# Patient Record
Sex: Female | Born: 1970 | Race: White | Hispanic: Yes
Health system: Western US, Academic
[De-identification: ages and names within clinical notes are randomized; demographics above are authoritative.]

## PROBLEM LIST (undated history)

## (undated) DIAGNOSIS — IMO0001 Reserved for inherently not codable concepts without codable children: Secondary | ICD-10-CM

## (undated) DIAGNOSIS — F419 Anxiety disorder, unspecified: Secondary | ICD-10-CM

## (undated) HISTORY — DX: Reserved for inherently not codable concepts without codable children: IMO0001

## (undated) HISTORY — DX: Anxiety disorder, unspecified: F41.9

---

## 2002-08-17 ENCOUNTER — Encounter (INDEPENDENT_AMBULATORY_CARE_PROVIDER_SITE_OTHER): Payer: Self-pay | Admitting: Internal Medicine

## 2002-08-17 ENCOUNTER — Ambulatory Visit (INDEPENDENT_AMBULATORY_CARE_PROVIDER_SITE_OTHER): Payer: PRIVATE HEALTH INSURANCE | Admitting: Internal Medicine

## 2002-08-17 VITALS — BP 104/68 | HR 90 | Temp 99.8°F | Wt 99.0 lb

## 2002-08-17 MED ORDER — ALLEGRA 180 MG OR TABS
ORAL_TABLET | ORAL | Status: DC
Start: 2002-08-17 — End: 2016-05-21

## 2002-08-17 NOTE — Nursing Note (Signed)
>>   Daryel Gerald [facility]                    08/17/2002 4:44 pm  Pt. here for both side ear pain X on and off. Seen ENT 3months ago at [facility].

## 2002-08-17 NOTE — Progress Notes (Signed)
Vickie Porter is a 31 year old female. Patient presents with:    Otitis      History of Present Illness:  Vickie Porter presents with bilateral ear pain and pressure for the past several months. The pain typically comes in both ears at once, is 8/10 at worst but is usually around 5/10. It is associated with a feeling of pressure in the ears, an inability to "pop" her ears, and decreased hearing. The pain radiates down toward the angle of the jaw and is sometimes worse with chewing. She has been seen for this problem in the past and has even been seen by an ENT who told her that the problem is from bruxism and that she needs to wear a mouthguard. SHe has been doing this but feels her symptoms have not improved in the past few months while wearing it.  She does not give a classic history for allergic rhinitis -- denies significant nasal congestion, rhinorrhea, sneezing, itchy or watery eyes.    Review of patient's past medical history indicates:   NO PAST MEDICAL HISTORY     Review of patient's past surgical history indicates:   removal of benign ovarian tumor 2002    Comment: [facility]    There is no problem list on file for this patient.    Current outpatient prescriptions:  none    Review of patient's family history indicates:   Cancer Mother    Comment: birth mother died of GI malignancy at 51   Cancer Father    Comment: prostate, died at age 7       Tobacco Use: Never    Alcohol Use: No       Allergies:Review of patient's allergies indicates no known allergies.      ROS:  Gen:no fever, sweats, chills, weight loss  Eye:no blurry vision, double vision, loss of vision  Ear:see HPI  Nose/Throat:no congestion or epistaxis, no sore throat  CV:no chest pain, palpitations, dyspnea, syncope, orthopnea, or PND  Pulm:no cough, sputum, hemoptysis, dyspnea, chest pain        Exam:  Vitals: T 99.8 P 90 BP 104/68  Gen: healthy, alert, no distress  HEENT:normocephalic, external auditory canals and tympanic  membranes clear, PERRLA, EOMI, normal nasal mucosa, minimal bilateral TMJ tenderness, no apparent jaw dislocation with movement, MMM, good dentition, no pharyngeal erythema  CV: PMI nondisplaced, regular rate and rhythm, no murmur, rub, or gallop  LUNG:clear to auscultation and percussion  EXT:no clubbing, cyanosis, or edema      IMPRESSION/PLAN:    1. otalgia - My differential includes allergies and TMJ syndrome. She describes some features of each of these. Trial of allegra. Continue to wear mouthguard. Return one week. If no improvement on allegra, stop it and try tylenol or NSAID.    388.70 OTALGIA NOS (primary encounter diagnosis)  Note:   Plan: ALLEGRA 180 MG OR TABS

## 2010-09-02 ENCOUNTER — Other Ambulatory Visit (HOSPITAL_COMMUNITY): Payer: Self-pay

## 2010-09-30 ENCOUNTER — Ambulatory Visit: Payer: Enrolled Prime—HMO | Attending: Adult Congenital Heart Disease | Admitting: Adult Congenital Heart Disease

## 2010-09-30 DIAGNOSIS — R0602 Shortness of breath: Secondary | ICD-10-CM | POA: Insufficient documentation

## 2010-09-30 DIAGNOSIS — R109 Unspecified abdominal pain: Secondary | ICD-10-CM | POA: Insufficient documentation

## 2010-09-30 DIAGNOSIS — Q268 Other congenital malformations of great veins: Secondary | ICD-10-CM | POA: Insufficient documentation

## 2010-09-30 DIAGNOSIS — R002 Palpitations: Secondary | ICD-10-CM | POA: Insufficient documentation

## 2010-09-30 DIAGNOSIS — R0789 Other chest pain: Secondary | ICD-10-CM | POA: Insufficient documentation

## 2010-11-04 ENCOUNTER — Ambulatory Visit: Payer: Enrolled Prime—HMO | Attending: Adult Congenital Heart Disease

## 2010-11-04 DIAGNOSIS — R0989 Other specified symptoms and signs involving the circulatory and respiratory systems: Secondary | ICD-10-CM | POA: Insufficient documentation

## 2010-11-18 ENCOUNTER — Ambulatory Visit: Payer: Enrolled Prime—HMO | Attending: Adult Congenital Heart Disease | Admitting: Adult Congenital Heart Disease

## 2010-11-18 DIAGNOSIS — Q268 Other congenital malformations of great veins: Secondary | ICD-10-CM | POA: Insufficient documentation

## 2011-11-18 ENCOUNTER — Ambulatory Visit: Payer: Enrolled Prime—HMO | Attending: Adult Congenital Heart Disease

## 2011-11-18 DIAGNOSIS — R0602 Shortness of breath: Secondary | ICD-10-CM | POA: Insufficient documentation

## 2011-11-18 DIAGNOSIS — R5383 Other fatigue: Secondary | ICD-10-CM | POA: Insufficient documentation

## 2011-11-18 DIAGNOSIS — Q263 Partial anomalous pulmonary venous connection: Secondary | ICD-10-CM | POA: Insufficient documentation

## 2011-11-18 DIAGNOSIS — R5381 Other malaise: Secondary | ICD-10-CM | POA: Insufficient documentation

## 2011-11-19 ENCOUNTER — Encounter (HOSPITAL_BASED_OUTPATIENT_CLINIC_OR_DEPARTMENT_OTHER): Payer: Enrolled Prime—HMO | Admitting: Adult Congenital Heart Disease

## 2012-12-12 ENCOUNTER — Ambulatory Visit (HOSPITAL_BASED_OUTPATIENT_CLINIC_OR_DEPARTMENT_OTHER)

## 2012-12-12 ENCOUNTER — Other Ambulatory Visit (HOSPITAL_BASED_OUTPATIENT_CLINIC_OR_DEPARTMENT_OTHER): Payer: Self-pay | Admitting: Internal Medicine

## 2012-12-12 ENCOUNTER — Ambulatory Visit: Attending: Internal Medicine

## 2012-12-12 DIAGNOSIS — R0989 Other specified symptoms and signs involving the circulatory and respiratory systems: Secondary | ICD-10-CM | POA: Insufficient documentation

## 2012-12-12 DIAGNOSIS — Q268 Other congenital malformations of great veins: Secondary | ICD-10-CM | POA: Insufficient documentation

## 2012-12-14 LAB — CARDIAC MRI FLOW/VELOCITY

## 2012-12-21 ENCOUNTER — Ambulatory Visit: Attending: Cardiovascular Disease

## 2012-12-21 DIAGNOSIS — I501 Left ventricular failure: Secondary | ICD-10-CM | POA: Insufficient documentation

## 2012-12-21 DIAGNOSIS — I428 Other cardiomyopathies: Secondary | ICD-10-CM | POA: Insufficient documentation

## 2013-12-27 ENCOUNTER — Encounter (HOSPITAL_BASED_OUTPATIENT_CLINIC_OR_DEPARTMENT_OTHER)

## 2014-01-17 ENCOUNTER — Encounter (HOSPITAL_BASED_OUTPATIENT_CLINIC_OR_DEPARTMENT_OTHER): Payer: Self-pay

## 2014-01-17 ENCOUNTER — Ambulatory Visit: Attending: Adult Congenital Heart Disease | Admitting: Adult Congenital Heart Disease

## 2014-01-17 VITALS — BP 103/70 | HR 89 | Ht <= 58 in | Wt 95.0 lb

## 2014-01-17 DIAGNOSIS — Q263 Partial anomalous pulmonary venous connection: Secondary | ICD-10-CM | POA: Insufficient documentation

## 2014-01-17 NOTE — Patient Instructions (Signed)
1. Follow up next year with a exercise test (bicycle CPET) and cardiac MRI  2. After that will follow-up every-other-year  3. Keep exercising.

## 2014-01-17 NOTE — Progress Notes (Signed)
[facility]  Visit date: 01/17/2014    Primary Care Physician: Jacob Moores T  Attending Cardiologist: Rob Bunting     Chief Complaint  Follow-up for partial anomalous pulmonary venous connection    Patient Active Problem List    Diagnosis Date Noted   . 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  Right inferior pulmonary vein to the inferior vena cava  A) cardiac MRI January 2014: QP QS 1.2-1.  Right ventricular end-diastolic volume approximately 85 mL per meter squared.  Note that this is incorrectly reported in the official radiology read.  B) Normal to above average exercise performance on serial cardiopulmonary exercise tests  C) Occasional palpitations which sound like isolated premature beats             History  43 year old female with partial anomalous pulmonary venous connection.  She was incidentally diagnosed in 2011 when a CT scan of the abdomen showed anomalous pulmonary vein draining to the inferior vena cava.  This was followed up with a cardiac MRI by Dr. 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  She was referred here to the Adult Congenital Heart Service at the [facility] for further evaluation.  At the time of her initial presentation she had a myriad of symptoms most of which were not cardiac in nature.  She had recently completed therapy for tuberculosis and was having considerable weight loss and being evaluated for Crohn's.  She had significant fatigue.  She underwent a cardiopulmonary exercise test which was reassuring with above average exercise tolerance.  We have followed her serially here at the [facility] and found right ventricular volumes to be stable and exercise performance remains normal or supra normal.    Since last visit she has felt well.  She exercises almost daily for 70 minutes on the treadmill at an incline.   Her energy level is improved.  She describes occasional skipped or early beats but has not had any palpitations which are prolonged.  She checks her pulse when these happen and finds a heart rate usually about 80-85 bpm.    Current Outpatient Prescriptions   Medication Sig Dispense Refill   . ALLEGRA 180 MG OR TABS Take 1 tablet by mouth daily for allergies  30  0   . Omeprazole 20 MG Oral CAPSULE DELAYED RELEASE Take 20 mg by mouth daily on an empty stomach.       . Unclassified (OTHER MEDS, SEE COMMENTS,) Muscle relaxer PRN for back pain         No current facility-administered medications for this visit.        Review of patient's allergies indicates:  Allergies   Allergen Reactions   . Benadryl [Diphenhydramine Hcl] Rash     TX FROM [facility]     . Iodine Shortness of Breath     TX FROM [facility]     . Percocet [Oxycodone-Acetaminophen] Rash     TX FROM [facility]         Her family history includes Cancer in her father and mother.    She  reports that she has never smoked. She does not have any smokeless tobacco history on file. She reports that she does not drink alcohol or use illicit drugs.    Review of Systems:   Complete review of systems is negative except as detailed in the history of  present illness.    Physical Exam:  BP 103/70  Pulse 89  Ht 4' 6.5" (1.384 m)  Wt 95 lb (43.092 kg)  BMI 22.5 kg/m2  SpO2 100%    Eyes/lids: PERRLA/EOMI and nl sclerae  ENT: nl carotid pulse waveform, no carotid bruits and no oropharyngeal lesions, good dentition  Head/Neck: normocephelic, atraumatic, supple, non-tender and no JVD at 30 degrees of incline  Respiratory: nl resp effort and no rales/wheeze/rhonchi  Cardiovascular: Regular rate and rhythm.  There is a normal S1.  S2 is physiologically split.  There is a 1/6 soft systolic ejection murmur most likely a flow murmur.  GI: nl bowel sounds, non-tender, non-distended and no masses, organomegaly  Musculoskeletal: nl gait, stance, no club, cyan, edema and nl bulk and  tone  Extremities: Normal, without deformities, edema, or skin discoloration, radial and DP pulses 2+ bilaterally  Skin: warm, well perfused and no breakdown or ulceration  Neuro/Psych: CN's II - XII grossly intact and mood and affect normal    Electrocardiogram  EKG normal sinus rhythm 87 bpm.  There is nonspecific T wave abnormalities.  It is otherwise a normal ECG    Impression:  43 year old female with single anomalous pulmonary venous connection presenting for annual follow-up.  We are quite pleased that she is doing well.  Her only symptom is palpitations which sound benign in nature.  Her energy level is improved and she has been able to exercise regularly without much impairment.  At this point I don't think there is a indication to move forward with surgery for her partial anomalous pulmonary venous connection.  First of all her exercise tolerance is been normal and she has not demonstrated significant right heart enlargement.  Secondly, the surgery for infra diaphragmatic pulmonary veins the IVC is difficult with the risk of pulmonary vein occlusion or thrombosis.  I have no doubt that an occluded right inferior pulmonary vein would symptomatically make her much worse.  For that reason I think an approach of watchful waiting is reasonable.  She will need follow-up studies to be sure that right heart volumes are remaining stable at the near or near normal level.  She should also have a cardiopulmonary exercise test to quantify whether she has any decline in functional capacity.    Recommendations:   Follow-up in 1 year with cardiac MRI/MRA to evaluate pulmonary vein anatomy, shunt fraction and right ventricular volumes and function.  I think the congenital initial with gadolinium protocol would be most appropriate here.   Follow-up in 1 year with cardiopulmonary exercise test   If those are reassuring with [place] as have her follow up to every 2-3 years

## 2015-01-15 ENCOUNTER — Ambulatory Visit (HOSPITAL_BASED_OUTPATIENT_CLINIC_OR_DEPARTMENT_OTHER)

## 2015-01-15 ENCOUNTER — Ambulatory Visit: Attending: Adult Congenital Heart Disease

## 2015-01-15 DIAGNOSIS — R931 Abnormal findings on diagnostic imaging of heart and coronary circulation: Secondary | ICD-10-CM

## 2015-01-15 DIAGNOSIS — Q263 Partial anomalous pulmonary venous connection: Secondary | ICD-10-CM | POA: Insufficient documentation

## 2015-01-15 DIAGNOSIS — Q264 Anomalous pulmonary venous connection, unspecified: Secondary | ICD-10-CM

## 2015-01-15 LAB — CREATININE BY I_STAT (POC), ~~LOC~~: Creatinine (POC): 0.7 mg/dL (ref 0.38–1.02)

## 2015-01-16 ENCOUNTER — Ambulatory Visit: Attending: Adult Congenital Heart Disease | Admitting: Adult Congenital Heart Disease

## 2015-01-16 ENCOUNTER — Encounter (HOSPITAL_BASED_OUTPATIENT_CLINIC_OR_DEPARTMENT_OTHER): Payer: Self-pay

## 2015-01-16 ENCOUNTER — Ambulatory Visit (HOSPITAL_BASED_OUTPATIENT_CLINIC_OR_DEPARTMENT_OTHER)

## 2015-01-16 VITALS — BP 101/66 | HR 101 | Ht <= 58 in | Wt 93.0 lb

## 2015-01-16 DIAGNOSIS — R002 Palpitations: Secondary | ICD-10-CM | POA: Insufficient documentation

## 2015-01-16 DIAGNOSIS — F32A Depression, unspecified: Secondary | ICD-10-CM | POA: Insufficient documentation

## 2015-01-16 DIAGNOSIS — F419 Anxiety disorder, unspecified: Secondary | ICD-10-CM | POA: Insufficient documentation

## 2015-01-16 DIAGNOSIS — F4310 Post-traumatic stress disorder, unspecified: Secondary | ICD-10-CM | POA: Insufficient documentation

## 2015-01-16 DIAGNOSIS — Q268 Other congenital malformations of great veins: Secondary | ICD-10-CM | POA: Insufficient documentation

## 2015-01-16 NOTE — Progress Notes (Signed)
[facility] Follow Up Visit  Visit date: 01/16/2015    Primary Care Physician: Vanessa Durham, MD  Referring Physician: No referring provider defined for this encounter.    Attending Cardiologist: Dr.Eric Fran Lowes, MD  Cardiology Fellow: Darliss Ridgel, MD    Chief Complaint   partial anomalous PV return    Identifying Information:  Vickie Porter is an 44 year old female who returns to the Adult Congenital Heart Disease clinic for follow up of a history of PAPVR with right PV drainage low in the IVC. She was last seen in clinic in 2015, at which pont we planned to f/u now with a repeat MRI and CPET.     Patient Active Problem List    Diagnosis Date Noted   . Anxiety [F41.9] 01/16/2015   . PTSD (post-traumatic stress disorder) [F43.10] 01/16/2015   . 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  Right inferior pulmonary vein to the inferior vena cava  A) cardiac MRI January 2014: QP QS 1.2-1.  Right ventricular end-diastolic volume approximately 85 mL per meter squared.  Note that this is incorrectly reported in the official radiology read. Unchanged RVEDVI, Qp:Qs similar estimated at 1.4 on f/u MRI 01/15/15. Contrast is not needed for follow up MRI studies (and potentially caused a reaction.)  B) Normal to above average exercise performance on serial cardiopulmonary exercise tests  C) Occasional palpitations which sound like isolated premature beats--event monitor ordered 01/2015             Interval History  Vickie Porter has been feeling well from a cardiovascular standpoint but hasn't been exercising as much due to back pain. She attributes this to working at a new job starting a few months ago (with [employer]) that didn't allow her time to take care of the problem. She has since quit that job and is planning to start PT for her back. Prior to that she was still running and denies unusual dyspnea, palpitations with exercise, chest pain or pressure, presyncope or  syncope. She has no LE swelling or increase in abdominal girth, and remains quite petite. Her weight has stabilized since her GI issues have improved. She notes persistent palpitations that she has attributed to stress, which occur randomly but usually at rest, lasting a couple of minutes each. She describes them as her heart going fast, but feel different then when she has a heavy or strong "extra beat" on occasion. Neither of these are associated with feeling dizzy, lightheaded or SOB. Her husband notes her heart rate will be fast when she has nightmares and will suddenly awaken, screaming. She herself usually does not fully arouse and goes back to sleep; this she associates with her PTSD.     Yesterday she felt well while exercising for the CPET, feeling limited by leg fatigue. She was told she did even better than her prior test last year. During the MRI she heard ringing in her ears, unsure if this was internal or from the machine. She pushed the alert button to call the staff's attention, and thinks she developed SOB and weakness shortly after that. She felt quite anxious at that point but was not prior to the ringing starting and did not feel claustrophibic. She was lifted out from the scanner and monitored due to tachycardia, BP normal, before sent home. Enough of the test was completed that she did not need to go back in the scanner, but she notes this all  occurred about a minute after the contrast was given. She wonders if this relates to her allergy to iodinated contrast, which caused dyspnea.     Current Outpatient Prescriptions   Medication Sig Dispense Refill   . Acetaminophen 325 MG Oral Tab Take 650 mg by mouth daily as needed.     . ALLEGRA 180 MG OR TABS Take 1 tablet by mouth daily for allergies 30 0   . ClonazePAM 0.5 MG Oral Tab Take 0.5 mg by mouth 2 times a day as needed.     . Omeprazole 20 MG Oral CAPSULE DELAYED RELEASE Take 20 mg by mouth daily on an empty stomach.     . Unclassified  (OTHER MEDS, SEE COMMENTS,) Muscle relaxer PRN for back pain       No current facility-administered medications for this visit.        History     Social History Narrative       Review of Systems:   Pertinent positives per HPI. All other systems were reviewed and are negative except as mentioned above    Physical Exam:  BP 101/66 mmHg  Pulse 101  Ht 4' 6.5" (1.384 m)  Wt 93 lb (42.185 kg)  BMI 22.02 kg/m2  SpO2 99%  Ht 4' 6.5" (1.384 m), Wt 93 lb (42.185 kg), Body mass index is 22.02 kg/(m^2).  Wt Readings from Last 3 Encounters:   01/16/15 93 lb (42.185 kg)   01/17/14 95 lb (43.092 kg)   08/17/02 99 lb (44.906 kg)     BP Readings from Last 3 Encounters:   01/16/15 101/66   01/17/14 103/70   08/17/02 104/68       General: Well-appearing, pleasant woman in no distress  Eyes: anicteric  HENT: Normocephalic, atraumatic. Normal dentition  Neck: Supple without lymphadenopathy or thyromegaly  CV: Normal S1 and S2. Regular rate and rhythm without murmurs, rubs or gallops. PMI nondisplaced. No JVD, no edema  Pulm: Clear to auscultation bilaterally without wheezes, rales or rhonchi  Extremities: Warm, no distal clubbing or cyanosis.  Skin: No rashes or concerning lesions  Neuro: Normal gait, MAE  MSK: no gross deformity  Psych: mildly anxious    Labs:    Studies reviewed:     CMRI from yesterday:   MRI 01/15/2015:SUMMARY:  1. As identified on prior exams, there is anomalous pulmonary venous drainage of the right lower lobe into the suprahepatic IVC at the level of the diaphragm. 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 No definite atrial septal defect is identified.  2. The left ventricle is normal in size (LVEDVI = 66.12 ml/m2) with mildly decreased systolic function (EF = 55.96%). Previously left ventricle is normal in size (LVEDVI = 65.90 ml/m2) with normal systolic function (EF = 58.68%) in 2014.  3. The right ventricle is  normal in size (RVEDVI = 84.3 ml/m2) with mildly reduced systolic function (EF = 44.62%).   Previously moderately increased in size (RVEDVI = 97.29 ml/m2) (Though see problem list, re-measured RV volume by us is unchanged from this. Also reported with moderately reduced systolic function based off other volumes (EF = 38.62%) in 2014.)    CPET report is pending. FEV1 is >100% predicted from related PFTs.     Assessment and Plan  Vickie Porter is an 44 year old female seen in Cardiology clinic for follow up of partial anomalous PV return with right inferior vein  drainage to the IVC at the level of the diaphragm. She fortunately has stable RV size, no signs or symptoms to suggest she has any limitation or complications from this. Her CPET results are pending but she tells [place] she did better than her prior, which showed excellent exercise capacity.   Her symptoms after gadolinium injection are hard to parse out from her anxiety, which is also a prominent symptom for her, but fortunately she should not need contrast for MRI follow up in the future (volumes can be measured without this, and her anatomy is well-characterized on prior MRI.)  We will have her wear a heart monitor to evaluate her palpitations, as those are quite persistent.     Recommendations:    PAPVR: unlikely to benefit from surgery or require this at any time given stability and normal RV size. This would require a long baffle given the location, which would be at further risk for subsequent stenosis and dysfunction.    Palpitations: Event monitor ordered, to wear until she has multiple episodes of her typical palpitations (up to 30 days); instructed her to keep a diary with this   Continue heart healthy diet and exercise, limit tobacco exposure, and continue routine CVD risk factor assessment and modification as needed.     Plan to return to clinic in 2 years with a TTE at that time to evaluate RV size/function, and estimated PASP

## 2015-01-16 NOTE — Progress Notes (Signed)
Patient was instructed and hooked up to a 30 day [place] event monitor.  Per Dr. Fran LowesKrieger, patient will record a few episodes and then return the monitor.

## 2015-01-16 NOTE — Patient Instructions (Addendum)
Your MRI looks good and is unchanged from 2 years ago. I think you're palpitations do not sound too concerning and likely are related to your heart simply going faster with stress (which is normal) but we want to have you wear a heart monitor to be sure you aren't also having an abnormal heart rhythm.      We don't need another MRI for another 4 years or so, and you will not need contrast. We'll plan to do a heart ultrasound in about 2 years.     Let us know if you have any questions or concerns, otherwise we'll see you back in about 2 years at the time of your echo.

## 2015-01-23 NOTE — Progress Notes (Signed)
I saw and evaluated the patient. I have reviewed Dr. Dessa PhiKearney's documentation and agree with it.    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  At this point and continue to recommend against surgical closure as the patient is asymptomatic and RV volumes do not warrant treatment and surgical correction would be difficult with potential complications including venous obstruction or thrombosis.

## 2015-03-14 ENCOUNTER — Telehealth (HOSPITAL_BASED_OUTPATIENT_CLINIC_OR_DEPARTMENT_OTHER): Payer: Self-pay

## 2015-03-14 NOTE — Telephone Encounter (Signed)
Called pt regarding Event Monitor results. Vm box full and no option to page. Will attempt later today to contact pt. Debi Boling

## 2015-03-15 NOTE — Telephone Encounter (Signed)
Spoke with Ms Vickie Porter. Advised, per Jae DireKate, "Event monitor results are reassuring that her palpitations are not related to any heart arrhythmia. She did have an isolated PAC for one activation; this may relate to her symptoms but are normal and do not require any treatment.   Would continue current plan to f/u in 33yr with an echo." Ms Vickie Porter did not have any questions or concerns. Vickie Porter

## 2015-11-01 ENCOUNTER — Telehealth (HOSPITAL_BASED_OUTPATIENT_CLINIC_OR_DEPARTMENT_OTHER): Payer: Self-pay

## 2015-11-01 NOTE — Telephone Encounter (Signed)
Dr[facility] Mallory [facility]us [facility] to [facility] to Dr. Fran LowesKrieger.  Faxed chart note, MRI and EKG to [phone].  P[facility]rieger [facility] with direct call back.  Gave Dr. Mallory Shirkoupherus direct desk line to call back with any questions.    Karolee StampsAmanda K Meier, RN

## 2016-05-06 ENCOUNTER — Telehealth (HOSPITAL_BASED_OUTPATIENT_CLINIC_OR_DEPARTMENT_OTHER): Payer: Self-pay

## 2016-05-06 NOTE — Telephone Encounter (Signed)
Pt called at the request of Dr. Madilyn FiremanKremberg at [facility] [place] [facility].  The pt states to "have had some EKG changes and needs to be seen".  The pt could not tell me directly why she was seen, but [facility], 6/15, and 6/16. She saw Dr. Madilyn FiremanKremberg on 6/19 and then called us for an appointment.    Earliest available visit was 7/6 with [facility], will request medical records and discuss if pt needs to be seen sooner.    Pt verbalized understanding and will hear from us if we need to change her appointment.    [facility]ed [facility].  Spoke to AndersonAnnette at [phone], confirmed the pt was seen at the hospital recently.  A faxed request for records and testing has been sent to [phone].    Will await records to discuss with providers.

## 2016-05-13 NOTE — Telephone Encounter (Signed)
Received documentation, no EKG's were sent - unable to be found by provider.    We will continue to see the pt on 7/6 with an EKG.    Records being scanned into media.

## 2016-05-21 ENCOUNTER — Ambulatory Visit: Attending: Family | Admitting: Family

## 2016-05-21 ENCOUNTER — Encounter (HOSPITAL_BASED_OUTPATIENT_CLINIC_OR_DEPARTMENT_OTHER): Payer: Self-pay

## 2016-05-21 ENCOUNTER — Ambulatory Visit (HOSPITAL_BASED_OUTPATIENT_CLINIC_OR_DEPARTMENT_OTHER)

## 2016-05-21 VITALS — BP 117/70 | HR 82 | Ht <= 58 in | Wt 94.4 lb

## 2016-05-21 DIAGNOSIS — Z6822 Body mass index (BMI) 22.0-22.9, adult: Secondary | ICD-10-CM

## 2016-05-21 DIAGNOSIS — R9431 Abnormal electrocardiogram [ECG] [EKG]: Secondary | ICD-10-CM

## 2016-05-21 DIAGNOSIS — R002 Palpitations: Secondary | ICD-10-CM

## 2016-05-21 DIAGNOSIS — Q268 Other congenital malformations of great veins: Secondary | ICD-10-CM | POA: Insufficient documentation

## 2016-05-21 NOTE — Progress Notes (Signed)
ADULT CONGENITAL CARDIOLOGY CLINIC NOTE       REASON FOR VISIT     Vickie Porter returns today for follow-up of partial anomalous venous return and new palpitations     PROBLEM LIST     Patient Active Problem List    Diagnosis Date Noted   . Anxiety and depression [F41.9, F32.9] 01/16/2015   . PTSD (post-traumatic stress disorder) [F43.10] 01/16/2015   . 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  Right inferior pulmonary vein to the inferior vena cava  A) cardiac MRI January 2014: QP QS 1.2-1.  Right ventricular end-diastolic volume approximately 85 mL per meter squared.  Note that this is incorrectly reported in the official radiology read.    MRI 3/1/1: Unchanged RVEDVI, Qp:Qs similar estimated at 1.4 on f/u6. Contrast is not needed for follow up MRI studies (and potentially related to symptoms during this test.)  B) Normal to above average exercise performance on serial cardiopulmonary exercise tests  C) Occasional palpitations which sound like isolated premature beats--event monitor ordered 01/2015             HISTORY OF PRESENT ILLNESS     Vickie Brighamlizabeth Ress is a 45 year old female with  partial anomalous venous return and new palpitations for which she was [facility] about 4 times in the middle of June 2017. She was also having ongoing back pain that is fairly severe and chronic. She states that they gave her a medicine in the ER for her palpitations that made her briefly feel really bad and they did that twice but it didn't seem to help. She last had a palpitation episode last night the first one since her episodes that lasted about 20 minutes in the middle of June.    Today she denies shortness of breath, chest pain, dizziness, or syncope. She is on gabapentin for her back pain and plans to try physical therapy as well. She has tried Naproxen and it bothers her stomach and she has taken muscle relaxants occasionally though none of it helps to any  lasting degree.       MEDICATIONS  Current Outpatient Prescriptions   Medication Sig Dispense Refill   . Acetaminophen 325 MG Oral Tab Take 650 mg by mouth daily as needed.     . ClonazePAM 0.5 MG Oral Tab Take 0.5 mg by mouth 2 times a day as needed.     . Naproxen 500 MG Oral Tab Take 250 mg by mouth 2 times a day as needed.     . Omeprazole 20 MG Oral CAPSULE DELAYED RELEASE Take 20 mg by mouth daily on an empty stomach.       No current facility-administered medications for this visit.        ALLERGIES  Review of patient's allergies indicates:  Allergies   Allergen Reactions   . Benadryl [Diphenhydramine Hcl] Rash     TX FROM [facility]     . Iodine Shortness of Breath     TX FROM [facility]     . Percocet [Oxycodone-Acetaminophen] Rash     TX FROM [facility]          REVIEW OF SYSTEMS  A complete review of systems was performed.  Pertinent positives are included in the interval history. . All remaining systems were reviewed and were negative.     PHYSICAL EXAMINATION     Vitals:    05/21/16 0906   BP: 117/70  Pulse: 82   SpO2: 99%   Weight: (!) 94 lb 6.4 oz (42.8 kg)   Height: 4' 6.5" (1.384 m)     General appearance:  alert, adult female in no acute distress  HEENT: Mucous membranes are moist and sclerae anicteric   NEURO: PERL, Symmetrically moves all extremities, facial expressions symmetrical, no slurred speech, answers questions appropriately  Thorax/Lungs:   Lungs clear to auscultation without rales or rhonchi. Normal effort of breathing  Heart: Regular rate and rhythm. Normal S1, S2 without murmur, rub or gallop and no significant JVD  Abdomen: Soft nontender nondistended, no hepatomegaly  Musculoskeletal: Warm and well perfused. Radial pulses 2+ bilaterally  Skin and nails: No clubbing cyanosis or edema. No skin rashes.        Diagnostic Testing  ECG 05/21/2016 shows normal sinus rhythm and nonspecific T wave abnormality, unchanged from the prior ECG in 2015.    Event monitor from 01/2015 showed sinus rhythm and sinus  tachycardia corresponding with reported symptoms.     ASSESSMENT AND PLAN     Vickie Brighamlizabeth Parchment is a 45 year old female with partial anomalous venous return and new palpitations for which she was seen in an outside ER in mid June and by patient report was given a medication that sounds like adenosine twice to no effect. She also endorses concomitant anxiety. She is also in significant chronic back pain that is not particularly well controlled right now though she has tried a number of things through Dr Odessa FlemingKlein's office. She is otherwise asymptomatic from a cardiac standpoint. Her ECG is unchanged    Event monitor today.  Follow up with Antonietta BarcelonaEric Krieger in one year as previously planned.    Domingo PulseMary E Rapheal Masso, ARNP

## 2016-05-21 NOTE — Progress Notes (Signed)
Placed pt on a 30 day event monitor, gave instructions on what to do during an event. Instructed patient on how to return the monitor after the 30 days.

## 2016-05-21 NOTE — Patient Instructions (Signed)
Follow up in one year as previously planned to see Dr Jeanie SewerKieger  Event monitor today.

## 2016-05-27 ENCOUNTER — Telehealth (HOSPITAL_BASED_OUTPATIENT_CLINIC_OR_DEPARTMENT_OTHER): Payer: Self-pay

## 2016-05-27 NOTE — Telephone Encounter (Signed)
Husband called stating Vickie Porter is having "racing heart rates".  Called 911.  They refused to go in to Ed when medics arrived.  HR was "160".    She is wearing 30 day monitor - received tracings of event from today - rate is 147 sinus tach.    Called Hank back and told him I would review with Libs, ARNP tomorrow and see if she needs to keep wearing monitor, if she needs new medication/plan, or referral to EP.    He was agreeable to this plan, took my direct phone number, and also reviewed when to call 911.    All questions answered.    Karolee StampsAmanda K Meier, RN

## 2016-05-28 ENCOUNTER — Telehealth (HOSPITAL_BASED_OUTPATIENT_CLINIC_OR_DEPARTMENT_OTHER): Payer: Self-pay | Admitting: Family

## 2016-05-28 NOTE — Telephone Encounter (Signed)
I spoke with Lanora ManisElizabeth who goes by Marisue IvanLiz and discussed the mechanics of pushing the button on the event monitor in order to catch and record the event that she is experiencing. After clarification that when she initiates pushing the button, the monitor will record back 30 seconds or more even if she has to push several other buttons to complete the process. I reassured her that is does record back before she finishes the whole menu of actions. With this understannding, she stated that she would try to be more prompt about pushing the button. She did include an important detail that she was watching her heart rhythm and only pushing the button when it is going above 130. I asked her to push the button when she feels something unusual with her heart rhythm or if she feels a symptoms and not to push it based on heart rate alone. She verbalized understanding and we agreed that she will keep this monitor for now and try to alter her process for using the monitor to see if we can catch the rhythm as it is starting. She does state that it only slowly ramps up to 150 bpm which I explained argues against and arrhythmia and more for anxiety induced sinus tachycardia.    I discussed her event monitor tracings with Dr Suella BroadSeslar who agrees with the above plan and if we still aren't able to see the beginning of the event then we may need to switch to a CAM or ECAT monitor in order to diagnose this with any certainty. He would prefer having more info before doing a consult on her.

## 2016-06-01 ENCOUNTER — Telehealth (HOSPITAL_BASED_OUTPATIENT_CLINIC_OR_DEPARTMENT_OTHER): Payer: Self-pay

## 2016-06-01 NOTE — Telephone Encounter (Signed)
Called and left second message for the pt asking for a callback if she is having any concerns about her symptoms.

## 2016-06-01 NOTE — Telephone Encounter (Signed)
Received a voicemail from [place], Vickie Porter's husband, stating that Vickie Porter was experiencing the "heart rhythms again" and she was shaking and not feeling well.    Called and left a VM on both husband and pt phone asking for a callback to discuss her symptoms.  Pt is wearing her 30 day monitor and strips of sinus tach were recorded.    Will wait to hear from the pt.

## 2016-06-03 ENCOUNTER — Telehealth (HOSPITAL_BASED_OUTPATIENT_CLINIC_OR_DEPARTMENT_OTHER): Payer: Self-pay

## 2016-06-03 NOTE — Telephone Encounter (Signed)
Pt called concerned because she had stopped her muscle relaxant as she thought that it might be contributing to her palpitations. Since cessation, however, has continued to experience same frequency of palpitations and is also in considerable musculoskeletal pain. Pt wondering if ok for her to resume original muscle relaxant.  Advised pt that as muscle relaxant did not appear to be a contributor to her palpitations, ok for her to resume but to continue closely monitor symptoms and wear event monitor.  Pt verbalized understanding and has no further questions but will call w any concerns.  Vivi MartensMichelle G Sharp, RN

## 2016-07-10 ENCOUNTER — Telehealth (HOSPITAL_BASED_OUTPATIENT_CLINIC_OR_DEPARTMENT_OTHER): Payer: Self-pay

## 2016-07-10 NOTE — Telephone Encounter (Addendum)
Results reviewed with Libs, ARNP and communicated to Vickie Porter.  She verbalized she has no additional questions.      "Her monitor results do not show any arrhythmias associated with her symptoms and generally no arrhythmias were noted. Has she been to see her primary doctor to look for noncardiac reasons like thyroid issues, anemia etc that could be causing her symptoms."

## 2016-08-07 ENCOUNTER — Telehealth (HOSPITAL_BASED_OUTPATIENT_CLINIC_OR_DEPARTMENT_OTHER): Payer: Self-pay

## 2016-08-07 NOTE — Telephone Encounter (Signed)
Pt let the EP scheduler know that she was having symptoms and needed to speak with a nurse.    Called and spoke with the patient.  She has frequent periods pf palpitations (4 times just today) that self resolve.  She also stated that she has panic/anxiety attacks that she gets confused with the symptoms on.  She does have some shortness of breath with the irregular rhythm but it also resolves.  She has no complaints of dizziness or chest pain.    The pt has an appt on 08/13/16 to see Dr. Roxan Hockeyobinson.  Advised the pt that she needs to see the EP provider but if her symptoms between now and that appointment are unmanageable, she needs to be seen in the ED.    Pt verbalized understanding and will call if she has any further questions.

## 2016-08-13 ENCOUNTER — Ambulatory Visit: Attending: Cardiovascular Disease | Admitting: Cardiovascular Disease

## 2016-08-13 ENCOUNTER — Ambulatory Visit (HOSPITAL_BASED_OUTPATIENT_CLINIC_OR_DEPARTMENT_OTHER)

## 2016-08-13 ENCOUNTER — Encounter (HOSPITAL_BASED_OUTPATIENT_CLINIC_OR_DEPARTMENT_OTHER): Payer: Self-pay | Admitting: Cardiovascular Disease

## 2016-08-13 VITALS — BP 93/61 | HR 98 | Ht <= 58 in | Wt 92.0 lb

## 2016-08-13 DIAGNOSIS — R002 Palpitations: Secondary | ICD-10-CM | POA: Insufficient documentation

## 2016-08-13 DIAGNOSIS — I4581 Long QT syndrome: Secondary | ICD-10-CM

## 2016-08-13 DIAGNOSIS — Z6821 Body mass index (BMI) 21.0-21.9, adult: Secondary | ICD-10-CM

## 2016-08-13 LAB — THYROID STIMULATING HORMONE: Thyroid Stimulating Hormone: 1.42 u[IU]/mL (ref 0.400–5.000)

## 2016-08-13 MED ORDER — METOPROLOL SUCCINATE ER 25 MG OR TB24
25.0000 mg | EXTENDED_RELEASE_TABLET | Freq: Every day | ORAL | 3 refills | Status: AC
Start: 2016-08-13 — End: ?

## 2016-08-13 NOTE — Progress Notes (Signed)
The patient was fitted with a seven day CAM.

## 2016-08-13 NOTE — Progress Notes (Signed)
CARDIAC ELECTROPHYSIOLOGY CLINIC INITIAL CONSULT  DATE OF SERVICE: 08/13/16    NAME: AAIMA GADDIE  DATE OF BIRTH: [birth year]  MRN: [id number]    REASON FOR CONSULT:  Evaluation of palpitations    REFERRING PHYSICIAN:    Antonietta Barcelona, MD    PRIMARY CARE:   Vanessa Durham, MD (General)    HPI:    Mrs. Rainwater is a 45 year-old woman with partial anomalous venous return, normal RV function, presenting for evaluation of palpitations. Symptoms started around Jan 2017. She describes a feeling of rush, and rapid palpitations on a near daily basis. They last about 5-10 mins. Mostly felt while at rest, not during exertion. Occasional awakenings with palpitations. No syncope. She is very fearful from the palpitations, and no longer leaves the house unaccompanied.  A 30-day event monitor showed sinus tachycardia, up to the 130s during time of symptoms.    Otherwise she complains of chronic intermittent chest pain of 3 years duration. Some pleuritic component, lasting between split second to 10 minutes. She exercises twice a week, walking slowly on a treadmill.     PAST MEDICAL HISTORY:  Patient Active Problem List    Diagnosis Date Noted   . Palpitations [R00.2] 08/13/2016   . Anxiety and depression [F41.9, F32.9] 01/16/2015   . PTSD (post-traumatic stress disorder) [F43.10] 01/16/2015   . 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  Right inferior pulmonary vein to the inferior vena cava  A) cardiac MRI January 2014: QP QS 1.2-1.  Right ventricular end-diastolic volume approximately 85 mL per meter squared.  Note that this is incorrectly reported in the official radiology read.    MRI 3/1/1: Unchanged RVEDVI, Qp:Qs similar estimated at 1.4 on f/u6. Contrast is not needed for follow up MRI studies (and potentially related to symptoms during this test.)  B) Normal to above average exercise performance on serial cardiopulmonary exercise tests  C) Occasional palpitations which sound like  isolated premature beats--event monitor ordered 01/2015               SH:  Accompanied by her husband. Originally from [place]. Lifetime non-smoker. No alcohol abuse.    FH:  No heart failure, SCD.    ALLERGIES:  Benadryl [diphenhydramine hcl]; Iodine; and Percocet [oxycodone-acetaminophen]    Current Outpatient Prescriptions   Medication Sig Dispense Refill   . Acetaminophen 325 MG Oral Tab Take 650 mg by mouth daily as needed.     . ClonazePAM 0.5 MG Oral Tab Take 0.5 mg by mouth 2 times a day as needed.     . Metoprolol Succinate ER 25 MG Oral TABLET SR 24 HR Take 1 tablet (25 mg) by mouth daily. Do not chew or crush. 90 tablet 3   . Naproxen 500 MG Oral Tab Take 250 mg by mouth 2 times a day as needed.     . Omeprazole 20 MG Oral CAPSULE DELAYED RELEASE Take 20 mg by mouth daily on an empty stomach.       No current facility-administered medications for this visit.        REVIEW OF SYSTEMS:   CONSTITUTIONAL:  No weight loss, fevers  HEENT: No rhinorrhea, vision changes  RESPIRATORY:  No cough, shortness of breath  CARDIAC:  Above  GASTROINTESTINAL:  No diarrhea, constipation, abdominal pain  URINARY:  No incontinence, dysuria  NEUROLOGIC:  No dizziness, weakness, numbness, syncope  DERMATOLOGIC:  No rashes, lesions  MUSCULOSKELETAL:  No joint pain  HEMATOLOGIC:  No bruising, bleeding  PSYCHIATRIC:  No depression, anxiety    PHYSICAL EXAM:  BP 93/61   Pulse 98   Ht 4' 6.5" (1.384 m)   Wt (!) 92 lb (41.7 kg)   SpO2 100%   BMI 21.78 kg/m   GEN: appears comfortable at rest.  HEENT: JVP within normal limits sitting at 45 deg  LUNGS: CTA    Cv: regular rhythm. Somewhat tachycardic 95-100 bpm. No murmurs. No LE edema. Warm distal extremities.  ABD: soft, non-tender  EXT: normal muscle mass  SKIN: no rashes  PSYCH: normal affect  NEURO: non-focal    DIAGNOSTIC DATA:    ECG (08/13/2016): sinus tach , rate 100 bpm. PR 120. QRS 90. QT 370. No ventricular pre-excitation.    30-day event monitor (05/2016):  Sinus rhythm.  Likely sinus tachycardia up to 130s during times of symptoms.    IMPRESSION:   1. Partial anomalous PV return (RIPV to IVC). Normal RV size and systolic function  2. Palpitations with sinus tachycardia, inferior axis P-waves    DISCUSSION:  Mrs. Marca AnconaGilmore has palpitations corresponding to recorded sinus tachycardia. The morphology of the P-wave is a bit unusual, specially in lead I. The PR is also short, but not frankly abnormal. She is notably tachycardic at rest with HR 95-100 bpm. No obvious hemodynamic historical or physical exam findings to explain that. At this time, the differential diagnosis of palpitations is:   sinus tachycardia (secondary to systemic process, or inappropriate)   high-to-low atrial tachycardia    It would be useful to obtain an alternative monitor tracing that would provide a heart rate trend. This would help in differentiating those 2 possibilities.    PLAN:  1. 7-day Holter monitor (CAM)  2. Metoprolol XL 25 mg daily  3. TSH  4. If suspicion for atrial tachycardia remains, or if symptoms don't improve then will discuss EP study option with the patient    FOLLOW-UP:  3 months      Caren GriffinsNabil Telitha Plath, MD  EP Fellow  Pager# [phone]

## 2016-08-13 NOTE — Patient Instructions (Signed)
   Start metoprolol XL 25 mg daily for palpitations   Check thyroid blood test   7-day monitor ordered today

## 2016-08-14 ENCOUNTER — Telehealth (HOSPITAL_BASED_OUTPATIENT_CLINIC_OR_DEPARTMENT_OTHER): Payer: Self-pay | Admitting: Cardiovascular Disease

## 2016-08-14 NOTE — Progress Notes (Signed)
CLINICAL ELECTROPHYSIOLOGY ATTENDING NOTE    I saw and evaluated the patient and agree with Dr. Zeineh's note.  I repeated key portions of the history and physical and have reviewed the documentation.  The note reflects my edits.      Jandy Brackens, MD, FHRS, FACC  Cardiac Electrophysiology Section  Division of Cardiology

## 2016-08-14 NOTE — Telephone Encounter (Addendum)
-----   Message from Elroy ChannelMelissa Rene Robinson, MD sent at 08/13/2016  4:49 PM PDT -----  We can let her know that her thyroid was normal.    ----- Message -----  From: [facility] Results Interface, User  Sent: 08/13/2016   1:44 PM  To: Elroy ChannelMelissa Rene Robinson, MD        Called Ms Vickie Porter and made her aware of her Thyroid results and that they are normal.  She will pick up her Metoprolol today and start it today.

## 2016-09-01 ENCOUNTER — Ambulatory Visit: Attending: Cardiovascular Disease

## 2016-09-01 DIAGNOSIS — R002 Palpitations: Secondary | ICD-10-CM | POA: Insufficient documentation

## 2016-09-03 ENCOUNTER — Encounter (HOSPITAL_BASED_OUTPATIENT_CLINIC_OR_DEPARTMENT_OTHER): Payer: Self-pay | Admitting: Cardiovascular Disease

## 2016-09-08 ENCOUNTER — Telehealth (HOSPITAL_BASED_OUTPATIENT_CLINIC_OR_DEPARTMENT_OTHER): Payer: Self-pay | Admitting: Cardiovascular Disease

## 2016-09-08 NOTE — Telephone Encounter (Signed)
Vickie Porter is a 45 year old patient of Dr Jacques NavyMelissa Robinson.  Recently seen in clinic for evaluation of palpitations/tachycardia.  A CAM monitor was placed. Dr Roxan Hockeyobinson reviewed monitor.  Called patient and made her aware that her monitor was normal  Average HR 90.  Patient activated events corresponded with SR.  She verbalized understanding.  She will call our office as needed.

## 2016-10-01 ENCOUNTER — Telehealth (HOSPITAL_BASED_OUTPATIENT_CLINIC_OR_DEPARTMENT_OTHER): Payer: Self-pay | Admitting: Cardiovascular Disease

## 2016-10-01 NOTE — Telephone Encounter (Signed)
Vickie Porter is a 45 year old seen recently in EP clinic by Dr Jacques NavyMelissa Robinson for evaluation of her palpitations.  She had a A 30-day event monitor showed sinus tachycardia, up to the 130s during time of symptoms.  Dr Roxan Hockeyobinson placed a 7 day CAM that revealed a normal scan.  She calls today with c/o of feeling tired, nervous about palpitations and she has noticed since starting her Metoprolol her HR will go from 70 to 115 at rest before starting Metoprolol it went up to 150.  She has a follow up with Dr Roxan Hockeyobinson on 11-12-16.  She states that she believes she is having panic attacks/anxiety.

## 2016-10-02 NOTE — Telephone Encounter (Signed)
Reviewed with Vickie SilverSusanne Steffes, NP.  Called Vickie Porter and left her a message to call our office at [phone]. No changes at this time.  Metoprolol is helping with keeping her HR down.  Plan to see her in clinic in December.

## 2016-11-11 NOTE — Progress Notes (Deleted)
ELECTROPHYSIOLOGY CLINIC RETURN VISIT    REASON FOR VISIT:  Follow up palpitations    REFERRING:    Vickie BarcelonaEric Krieger, MD    PRIMARY CARE:   Vickie Durhamhomas T Siler, MD (General)    HPI:  Vickie Porter returns today for follow-up.   She is a 45 year old female with a partial anomalous venous return who as seen on 08/13/2016 for evaluation of palpitations. Her symptoms began in January of last year occurring on a daily basis.  Since she was seen last in September, she was started on metoprolol.  A TSH level drawn at that time was normal.  She underwent a CAM monitor 9/28- 08/20/2016 with symptoms of palpitations that correlated with sinus rhythm. SSnus rhythm with heart rate averaging 90 bpm (61 to 161bpm). No significant ectopy was noted during this period.       If suspicion for atrial tachycardia remains, or if symptoms don't improve then will discuss EP study option with the patient    PMH:  Patient Active Problem List    Diagnosis Date Noted   . Palpitations [R00.2] 08/13/2016   . Anxiety and depression [F41.8] 01/16/2015   . PTSD (post-traumatic stress disorder) [F43.10] 01/16/2015   . 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  Right inferior pulmonary vein to the inferior vena cava  A) cardiac MRI January 2014: QP QS 1.2-1.  Right ventricular end-diastolic volume approximately 85 mL per meter squared.  Note that this is incorrectly reported in the official radiology read.    MRI 3/1/1: Unchanged RVEDVI, Qp:Qs similar estimated at 1.4 on f/u6. Contrast is not needed for follow up MRI studies (and potentially related to symptoms during this test.)  B) Normal to above average exercise performance on serial cardiopulmonary exercise tests  C) Occasional palpitations which sound like isolated premature beats--event monitor ordered 01/2015             SH:  Originally from [place]. Lifetime non-smoker. No alcohol abuse.  Social History     Social History Narrative    Lives with her  husband and kids in [place]. [facility]. Worked for the [employer] briefly but quit due to concerns of taking care of her health and needing more work flexibility to do so.      reports that she has never smoked. She does not have any smokeless tobacco history on file.    FH:  No heart failure, SCD.    ALLERGIES:  Benadryl [diphenhydramine hcl]; Iodine; and Percocet [oxycodone-acetaminophen]    Current Outpatient Prescriptions   Medication Sig Dispense Refill   . Acetaminophen 325 MG Oral Tab Take 650 mg by mouth daily as needed.     . ClonazePAM 0.5 MG Oral Tab Take 0.5 mg by mouth 2 times a day as needed.     . Metoprolol Succinate ER 25 MG Oral TABLET SR 24 HR Take 1 tablet (25 mg) by mouth daily. Do not chew or crush. 90 tablet 3   . Naproxen 500 MG Oral Tab Take 250 mg by mouth 2 times a day as needed.     . Omeprazole 20 MG Oral CAPSULE DELAYED RELEASE Take 20 mg by mouth daily on an empty stomach.       No current facility-administered medications for this visit.        REVIEW OF SYSTEMS     CONSTITUTIONAL:  No weight loss, fevers  HEENT: No rhinorrhea, vision changes  RESPIRATORY:  No cough, shortness of breath  CARDIAC:  Above  GASTROINTESTINAL:  No diarrhea, constipation, abdominal pain  URINARY:  No incontinence, dysuria  NEUROLOGIC:  No dizziness, weakness, numbness, syncope  DERMATOLOGIC:  No rashes, lesions  MUSCULOSKELETAL:  No joint pain  HEMATOLOGIC:  No bruising, bleeding  PSYCHIATRIC:  No depression, anxiety    PHYSICAL EXAM  There were no vitals taken for this visit.  GEN: pleasant, no distress  HEENT: anicteric  NECK:  no JVD, neck supple, trachea midline  LUNGS: clear  CV:   PMI discrete, ***, nl S1/S2, no M/R/G appreciated  ABD: soft, no HSM  EXT: no edema  SKIN:  No ecchymosis  PSYCH: appropriate, oriented,   NEURO:  Grossly intact, nl gait    ECG 08/13/2016:   Sinus VR 97, QT/Qtc 372/472 msec.    Event monitor 05/22/2016- 06/21/2016:  Sinus rhythm with rates ranging from 72-149 bpm.  PACs present.     CAM 08/13/2016-08/20/2016:  Sinus rhythm with heart rate averaging 90 bpm (61 to 161bpm).  Supraventricular ectopy represented less than 1% of beats, with one PAC pair.  Ventricular ectopy represented less than 1% of beats.  Patient activated events with symptoms of palpitations correlated with sinus rhythm    TSH 08/13/2016: Normal    IMPRESSION:  45 year old year old female with ***      PLAN:   ***    FOLLOW-UP:  ***    Aundria MemsSusanne L Amauria Younts, DNP, ARNP  Teaching Associate for Complex Ablation Program  Division of Cardiology, Electrophysiology   [facility]

## 2016-11-12 ENCOUNTER — Encounter (HOSPITAL_BASED_OUTPATIENT_CLINIC_OR_DEPARTMENT_OTHER): Payer: Self-pay | Admitting: Cardiovascular Disease

## 2016-11-12 ENCOUNTER — Ambulatory Visit: Attending: Cardiovascular Disease | Admitting: Cardiovascular Disease

## 2016-11-12 VITALS — BP 110/46 | HR 96 | Ht <= 58 in | Wt 92.0 lb

## 2016-11-12 DIAGNOSIS — Z6821 Body mass index (BMI) 21.0-21.9, adult: Secondary | ICD-10-CM

## 2016-11-12 DIAGNOSIS — R002 Palpitations: Secondary | ICD-10-CM | POA: Insufficient documentation

## 2016-11-12 MED ORDER — IVABRADINE HCL 5 MG OR TABS
5.0000 mg | ORAL_TABLET | Freq: Two times a day (BID) | ORAL | 3 refills | Status: AC
Start: 2016-11-12 — End: ?

## 2016-11-12 NOTE — Patient Instructions (Addendum)
   We will increase the metoprolol to the full 25mg  daily.  You can take it at night.   I have prescribed ivabradine 5mg  twice per day.  We will see if it is approved.   I strongly recommend that you begin seeing a therapist again as well for your PTSD and anxiety components.

## 2016-12-18 NOTE — Progress Notes (Signed)
ELECTROPHYSIOLOGY CLINIC RETURN VISIT    REASON FOR VISIT:  Follow up palpitations    REFERRING:             Vickie BarcelonaEric Krieger, MD    PRIMARY CARE:       Vickie Durhamhomas T Siler, MD (General)    HPI:  Vickie Porter returns today for follow-up.   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 Vickie symptoms began in January of last year occurring on a daily basis.  Since she was seen last in September, she was started on metoprolol.  A TSH level drawn at that time was normal.  She underwent a CAM monitor 9/28- 08/20/2016 with symptoms of palpitations that correlated with sinus rhythm. SSnus rhythm with heart rate averaging 90 bpm (61 to 161bpm). No significant ectopy was noted during this period.     She is seen today with Vickie Porter.  She remains very terrified, frankly, of going outside, being alone, etc.  She has not been seeing Vickie therapist and she has not taken the prescribed metoprolol dose.    PMH:  Patient Active Problem List    Diagnosis Date Noted   . Palpitations [R00.2] 08/13/2016   . Anxiety and depression [F41.8] 01/16/2015   . PTSD (post-traumatic stress disorder) [F43.10] 01/16/2015   . 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  Right inferior pulmonary vein to the inferior vena cava  A) cardiac MRI January 2014: QP QS 1.2-1.  Right ventricular end-diastolic volume approximately 85 mL per meter squared.  Note that this is incorrectly reported in the official radiology read.    MRI 3/1/1: Unchanged RVEDVI, Qp:Qs similar estimated at 1.4 on f/u6. Contrast is not needed for follow up MRI studies (and potentially related to symptoms during this test.)  B) Normal to above average exercise performance on serial cardiopulmonary exercise tests  C) Occasional palpitations which sound like isolated premature beats--event monitor ordered 01/2015               SH:  Originally from [place]. Lifetime  non-smoker. No alcohol abuse.  Social History         Social History Narrative    Lives with Vickie Porter and kids in [place]. [facility]. Worked for the [employer] briefly but quit due to concerns of taking care of Vickie health and needing more work flexibility to do so.      reports that she has never smoked. She does not have any smokeless tobacco history on file.    FH:  No heart failure, SCD.    ALLERGIES:  Benadryl [diphenhydramine hcl]; Iodine; and Percocet [oxycodone-acetaminophen]    Outpatient Medications Prior to Visit   Medication Sig Dispense Refill   . Acetaminophen 325 MG Oral Tab Take 650 mg by mouth daily as needed.     . ClonazePAM 0.5 MG Oral Tab Take 0.5 mg by mouth 2 times a day as needed.     . Metoprolol Succinate ER 25 MG Oral TABLET SR 24 HR Take 1 tablet (25 mg) by mouth daily. Do not chew or crush. 90 tablet 3   . Naproxen 500 MG Oral Tab Take 250 mg by mouth 2 times a day as needed.     . Omeprazole 20 MG Oral CAPSULE DELAYED RELEASE Take 20 mg by mouth daily on an empty stomach.       No  facility-administered medications prior to visit.        REVIEW OF SYSTEMS     CONSTITUTIONAL:  No weight loss, fevers  HEENT: No rhinorrhea, vision changes  RESPIRATORY:  No cough, shortness of breath  CARDIAC:  Above  GASTROINTESTINAL:  No diarrhea, constipation, abdominal pain  URINARY:  No incontinence, dysuria  NEUROLOGIC:  No dizziness, weakness, numbness, syncope  DERMATOLOGIC:  No rashes, lesions  MUSCULOSKELETAL:  No joint pain  HEMATOLOGIC:  No bruising, bleeding  PSYCHIATRIC:  No depression, anxiety    PHYSICAL EXAM  BP 110/46   Pulse 96   Ht 4' 6.5" (1.384 m)   Wt (!) 92 lb (41.7 kg)   SpO2 99%   BMI 21.78 kg/m   GEN: pleasant, no distress  HEENT: anicteric  NECK:  no JVD, neck supple, trachea midline  LUNGS: clear  CV:   PMI discrete, RRR, nl S1/S2, no M/R/G appreciated  ABD: soft, no HSM  EXT: no edema  SKIN:  No ecchymosis  PSYCH: appropriate, oriented,   NEURO:   Grossly intact, nl gait    ECG 08/13/2016:   Sinus VR 97, QT/Qtc 372/472 msec.    Event monitor 05/22/2016- 06/21/2016:  Sinus rhythm with rates ranging from 72-149 bpm. PACs present.     CAM 08/13/2016-08/20/2016:  Sinus rhythm with heart rate averaging 90 bpm (61 to 161bpm).  Supraventricular ectopy represented less than 1% of beats, with one PAC pair.  Ventricular ectopy represented less than 1% of beats.  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    I am concerned that Vickie Porter's psychiatric diagnoses are preventing Vickie from taking care of Vickie cardiovascular symptoms.  I have asked Vickie to start taking the metoprolol.  We spent much of the visit trying to unpackage Vickie anxiety.  It is severe and debilitating.  Vickie Porter is supportive but ill-equipped to assist, by his own admission.    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  She is very nervous about increasing the bet ablcoker as Vickie blood pressure is already on the lower end, but I think that she will be able to tolerate the 25mg  bid.  If not, then ivabradine should be added.    PLAN:   Start taking Vickie metoprolol at 25mg  bid   I added ivabradine as well, which may or may not be covered without increasing metoprolol further.   Psychatric/counselling services -- She has a Child psychotherapist as well    FOLLOW-UP:  6 months or sooner prn    Lorrene Reid, MD [facility]  Cardiac Electrophysiology Section  Cardiology Division

## 2017-02-12 ENCOUNTER — Other Ambulatory Visit (HOSPITAL_BASED_OUTPATIENT_CLINIC_OR_DEPARTMENT_OTHER): Payer: Self-pay | Admitting: Nurse Practitioner

## 2017-02-12 DIAGNOSIS — R002 Palpitations: Secondary | ICD-10-CM

## 2017-02-12 DIAGNOSIS — Q263 Partial anomalous pulmonary venous connection: Secondary | ICD-10-CM

## 2017-02-17 ENCOUNTER — Ambulatory Visit
Payer: Commercial Managed Care - HMO | Attending: Adult Congenital Heart Disease | Admitting: Adult Congenital Heart Disease

## 2017-02-17 ENCOUNTER — Other Ambulatory Visit (HOSPITAL_COMMUNITY): Payer: Self-pay

## 2017-02-17 ENCOUNTER — Encounter (HOSPITAL_BASED_OUTPATIENT_CLINIC_OR_DEPARTMENT_OTHER): Payer: Self-pay

## 2017-02-17 ENCOUNTER — Ambulatory Visit (HOSPITAL_BASED_OUTPATIENT_CLINIC_OR_DEPARTMENT_OTHER): Payer: Commercial Managed Care - HMO

## 2017-02-17 ENCOUNTER — Other Ambulatory Visit (HOSPITAL_BASED_OUTPATIENT_CLINIC_OR_DEPARTMENT_OTHER): Payer: Commercial Managed Care - HMO

## 2017-02-17 ENCOUNTER — Encounter (HOSPITAL_BASED_OUTPATIENT_CLINIC_OR_DEPARTMENT_OTHER): Payer: Commercial Managed Care - HMO

## 2017-02-17 VITALS — BP 109/66 | HR 108 | Ht <= 58 in | Wt 91.6 lb

## 2017-02-17 DIAGNOSIS — Z6821 Body mass index (BMI) 21.0-21.9, adult: Secondary | ICD-10-CM

## 2017-02-17 DIAGNOSIS — F418 Other specified anxiety disorders: Secondary | ICD-10-CM | POA: Insufficient documentation

## 2017-02-17 DIAGNOSIS — Q263 Partial anomalous pulmonary venous connection: Secondary | ICD-10-CM

## 2017-02-17 DIAGNOSIS — F419 Anxiety disorder, unspecified: Secondary | ICD-10-CM

## 2017-02-17 DIAGNOSIS — F32A Depression, unspecified: Secondary | ICD-10-CM

## 2017-02-17 NOTE — Progress Notes (Signed)
I saw and evaluated the patient. I have reviewed the fellow's documentation and agree with it.  TORIE TOWLE returns today with her history of a right lower pulmonary vein to the inferior vena cava.  She continues to have nonspecific symptoms but should've gone on for many years and seemed to be tied likely more due to depression in the due to deterioration of her cardiovascular condition.  Her echocardiogram from today is reassuring with right ventricular size which looks up or limits of normal and trace tricuspid insufficiency    I will have her follow-up in one year with a cardiopulmonary exercise test

## 2017-02-17 NOTE — Patient Instructions (Signed)
Thank you for your visit. The echocardiogram shows that your heart is working well. Please call us back if you continue to experience limitations when you exercise.     Otherwise we will plan to see you in one year with an exercise test.

## 2017-02-17 NOTE — Progress Notes (Signed)
ADULT CONGENITAL CARDIOLOGY CLINIC NOTE       CC. 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  Right inferior pulmonary vein to the inferior vena cava  A) cardiac MRI January 2014: QP QS 1.2-1.  Right ventricular end-diastolic volume approximately 85 mL per meter squared.  Note that this is incorrectly reported in the official radiology read.    MRI 3/1/1: Unchanged RVEDVI, Qp:Qs similar estimated at 1.4 on f/u6. Contrast is not needed for follow up MRI studies (and potentially related to symptoms during this test.)  B) Normal to above average exercise performance on serial cardiopulmonary exercise tests  C) Occasional palpitations which sound like isolated premature beats--event monitor ordered 01/2015           INTERVAL EVENTS     [name] returns today for follow-up of partial anomalous venous return and new palpitations. Her palpitations    She saw Dr. Roxan Hockey in EP in December 2017. Her CAM monitor in September and Event monitor in July where negative for any sustained arrhythmia. Dr. Roxan Hockey however thought presentation and pattern of tachycardia were consistent with inappropriate sinus tachycardia. Her metoprolol was increased to 25 mg BID and ivabradine was added to her regimen. She was unfortunately able to fill the ivabradine and since decided to stop the metoprolol altogether. She has felt that since stopping these medications the palpitations have not been an issue.     [name] reports a decline in her exercise tolerance. She feels that this is most likely due to her depression but is worried about a cardiac cause. She can walk at level ground without issues but would  get short of breath if attempting to walk up hill or climb > 3 flights of stair. There are no other associated symptoms.     Echo today shows normal biventricular function with normal  IV size.     Outpatient Medications Prior to Visit   Medication Sig Dispense Refill    Acetaminophen 325 MG Oral Tab Take 650 mg by mouth daily as needed.      ClonazePAM 0.5 MG Oral Tab Take 0.5 mg by mouth 2 times a day as needed.      Cyclobenzaprine HCl 5 MG Oral Tab Take 2.5 mg by mouth.      Ivabradine HCl 5 MG Oral Tab Take 5 mg by mouth 2 times a day. 180 tablet 3    Metoprolol Succinate ER 25 MG Oral TABLET SR 24 HR Take 1 tablet (25 mg) by mouth daily. Do not chew or crush. 90 tablet 3    Naproxen 500 MG Oral Tab Take 250 mg by mouth 2 times a day as needed.      Omeprazole 20 MG Oral CAPSULE DELAYED RELEASE Take 20 mg by mouth daily on an empty stomach.       No facility-administered medications prior to visit.      Vitals:    02/17/17 1425   BP: 109/66   BP Cuff Size: Small   BP Site: Right Arm   BP Position: Sitting   Pulse: (!) 108   SpO2: 100%   Weight: (!) 91 lb  9.6 oz (41.5 kg)   Height: 4' 6.5" (1.384 m)     Physical Exam   Constitutional: She appears healthy.   HENT:   Mouth/Throat: Oropharynx is clear.   Eyes: Conjunctivae are normal.   Neck: Normal range of motion.   Cardiovascular: Regular rhythm, S1 normal, S2 normal, normal heart sounds, intact distal pulses and normal pulses.  Tachycardia present.    Pulmonary/Chest: Effort normal and breath sounds normal. She has no rales.   Abdominal: Soft.   Musculoskeletal: Normal range of motion.   Neurological: She is alert and oriented to person, place, and time.     DIAGNOSTIC STUDIES    TTE 02/17/17  CONCLUSIONS:  The left ventricle's size is normal.   The calculated left ventricular ejection fraction, as determined by the biplane method of disks, is 63%.  the filling pattern suggests normal filling pressures  The right ventricle's size and function are  normal  The left atrial's size is normal  The right atrium is mildly enlarged  There is trace MR  There is mild TR  RV systolic pressure is 18 mm Hg  The IVC is mildly dilated, and undergoes normal collapse with sniff.       Event monitor 05/22/2016- 06/21/2016:  Sinus rhythm with rates ranging from 72-149 bpm. PACs present.     CAM 08/13/2016-08/20/2016:  Sinus rhythm with heart rate averaging 90 bpm (61 to 161bpm).  Supraventricular ectopy represented less than 1% of beats, with one PAC pair.  Ventricular ectopy represented less than 1% of beats.  Patient activated events with symptoms of palpitations correlated with sinus rhythm    TSH 08/13/2016:Normal    cMRI 01/2015  SUMMARY:  1. As identified on prior exams, there is anomalous pulmonary venous drainage of the right lower lobe into the suprahepatic IVC at the level of the diaphragm. 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 No definite atrial septal defect is identified.    2. The left ventricle is normal in size (LVEDVI = 66.12 ml/m2) with mildly decreased systolic function (EF = 55.96%). Previously left ventricle is normal in size (LVEDVI = 65.90 ml/m2) with normal systolic function (EF = 58.68%) in 2014.    3. The right ventricle is normal in size (RVEDVI = 84.3 ml/m2) with mildly reduced systolic function (EF = 44.62%). Previously moderately increased in size (RVEDVI = 97.29 ml/m2) with moderately reduced systolic function (EF = 38.62%) in 2014.       ASSESSMENT AND PLAN     Vickie Porter is a 46 year old female with partial anomalous venous return (right inferior pulmonary vein draining into her IVC). She also has a long standing palpitations which according to Dr. Roxan Hockey is likely secondary to inappropriate sinus tachycardia.     She reports a mild decline in her functional capacity given by easy fatigue and shortness of breath. Her echo today however is  reassuring with normal RV function and no evidence of significant right ventricular enlargement. She has also had prior CPETs that have been normal. Given her comorbidities we feel that her symptoms are most likely secondary to her depression/anxiety than her chronic pain than cardiac related. We feel that a CPET is not needed at this point.     Sonyia will try to get back to an exercising routine in the next few days and will call us back if she continues to have persistent symptoms of fatigue  or shortness of breath. We will get a CPET if this happens.     Otherwise we will plan to see her back in one year with a CPET at the time and without cardiac imaging.

## 2017-02-18 ENCOUNTER — Other Ambulatory Visit (HOSPITAL_BASED_OUTPATIENT_CLINIC_OR_DEPARTMENT_OTHER): Payer: Self-pay | Admitting: Student in an Organized Health Care Education/Training Program

## 2017-02-18 DIAGNOSIS — Q263 Partial anomalous pulmonary venous connection: Secondary | ICD-10-CM

## 2017-05-10 NOTE — Progress Notes (Deleted)
ELECTROPHYSIOLOGY CLINIC RETURN VISIT    REASON FOR VISIT:  Palpitations, IST    REFERRING:    Antonietta Barcelona, MD    PRIMARY CARE:   Vanessa Durham, MD (General)    HPI:  Vickie Porter is a 46 year old with a partial anomalous venous return who was seen initially on 08/13/2016 for palpitations.  A CAM 9/28-10/03/2016 showed symptoms of palpitations that correlated with sinus rhythm.  Her average heart rate was in the 90s.  She had been prescribed metoprolol, though, had not started taking it ***.  She was last seen by Dr. Roxan Hockey ***.   At this visit Ivabradine was added for IST, but unfortunately she was not able to fill this and as such discontinued the metoprolol as well.  Currently as of her visit with Dr. Fran Lowes, her palpitations have not been an issue.     She had an echocardiogram done on 02/17/2017 which showed normal biventricular function with normal LV size, EF 63%.       PMH:  Patient Active Problem List    Diagnosis Date Noted   . Palpitations [R00.2] 08/13/2016   . Anxiety and depression [F41.8] 01/16/2015   . PTSD (post-traumatic stress disorder) [F43.10] 01/16/2015   . 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  Right inferior pulmonary vein to the inferior vena cava  A) cardiac MRI January 2014: QP QS 1.2-1.  Right ventricular end-diastolic volume approximately 85 mL per meter squared.  Note that this is incorrectly reported in the official radiology read.    MRI 3/1/1: Unchanged RVEDVI, Qp:Qs similar estimated at 1.4 on f/u6. Contrast is not needed for follow up MRI studies (and potentially related to symptoms during this test.)  B) Normal to above average exercise performance on serial cardiopulmonary exercise tests  C) Occasional palpitations which sound like isolated premature beats--event monitor ordered 01/2015               SH: SH: Originally from [place]. Lifetime non-smoker. No alcohol abuse.  Social History     Social History Narrative    Lives with her  husband and kids in [place]. Previously evaluated at [facility]. Worked for the [employer] briefly but quit due to concerns of taking care of her health and needing more work flexibility to do so.      reports that she has never smoked. She has never used smokeless tobacco.    FH:  No heart failure, SCD.    ALLERGIES:  Benadryl [diphenhydramine hcl]; Iodine; and Percocet [oxycodone-acetaminophen]    Current Outpatient Prescriptions   Medication Sig Dispense Refill   . Acetaminophen 325 MG Oral Tab Take 650 mg by mouth daily as needed.     . ClonazePAM 0.5 MG Oral Tab Take 0.5 mg by mouth 2 times a day as needed.     . Cyclobenzaprine HCl 5 MG Oral Tab Take 2.5 mg by mouth at bedtime.      . Ivabradine HCl 5 MG Oral Tab Take 5 mg by mouth 2 times a day. (Patient not taking: Reported on 02/17/2017) 180 tablet 3   . Metoprolol Succinate ER 25 MG Oral TABLET SR 24 HR Take 1 tablet (25 mg) by mouth daily. Do not chew or crush. (Patient not taking: Reported on 02/17/2017) 90 tablet 3   . Naproxen 500 MG Oral Tab Take 250 mg by mouth 2 times a day as needed.     . Omeprazole 20 MG Oral CAPSULE DELAYED RELEASE  Take 20 mg by mouth daily on an empty stomach.       No current facility-administered medications for this visit.        REVIEW OF SYSTEMS     CONSTITUTIONAL:  No weight loss, fevers  HEENT: No rhinorrhea, vision changes  RESPIRATORY:  No cough, shortness of breath  CARDIAC:  Above  GASTROINTESTINAL:  No diarrhea, constipation, abdominal pain  URINARY:  No incontinence, dysuria  NEUROLOGIC:  No dizziness, weakness, numbness, syncope  DERMATOLOGIC:  No rashes, lesions  MUSCULOSKELETAL:  No joint pain  HEMATOLOGIC:  No bruising, bleeding  PSYCHIATRIC:  No depression, anxiety    PHYSICAL EXAM  There were no vitals taken for this visit.  GEN: pleasant, no distress  HEENT: anicteric  NECK:  no JVD, neck supple, trachea midline  LUNGS: clear  CV:   PMI discrete, ***, nl S1/S2, no M/R/G appreciated  ABD: soft, no HSM  EXT: no edema  SKIN:   No ecchymosis  PSYCH: appropriate, oriented  NEURO:  Grossly intact, nl gait, oriented X3    ECG 05/13/2017:    ECG 08/13/2016: Sinus VR 97, QT/Qtc 372/472 msec.    Event monitor 05/22/2016- 06/21/2016:  Sinus rhythm with rates ranging from 72-149 bpm. PACs present.     CAM 08/13/2016-08/20/2016:  Sinus rhythm with heart rate averaging 90 bpm (61 to 161bpm).  Supraventricular ectopy represented less than 1% of beats, with one PAC pair.  Ventricular ectopy represented less than 1% of beats.  Patient activated events with symptoms of palpitations correlated with sinus rhythm    TSH 08/13/2016:Normal    IMPRESSION:  46 year old female with partial anomalous venous return with palpitations.         PLAN:   ***    FOLLOW-UP:  ***    Aundria MemsSusanne L Nicki Furlan, DNP, ARNP, AGPCNP NP-C, AGPCNP-BC  Teaching Associate for Complex Ablation Program  Division of Cardiology, Electrophysiology   [facility]

## 2017-05-13 ENCOUNTER — Encounter (HOSPITAL_BASED_OUTPATIENT_CLINIC_OR_DEPARTMENT_OTHER): Payer: Commercial Managed Care - HMO | Admitting: Cardiovascular Disease

## 2017-10-06 ENCOUNTER — Telehealth (HOSPITAL_BASED_OUTPATIENT_CLINIC_OR_DEPARTMENT_OTHER): Payer: Self-pay | Admitting: Adult Congenital Heart Disease

## 2017-10-06 NOTE — Telephone Encounter (Signed)
Spoke with patient on 10/06/17 to reschedule her appointment with Dr. Fran LowesKrieger on  02/23/18 since he will be unavailable that day. Patient is currently out of town and was unable to reschedule at the moment. She states that she will give us a call back to reschedule.

## 2017-11-03 ENCOUNTER — Telehealth (HOSPITAL_BASED_OUTPATIENT_CLINIC_OR_DEPARTMENT_OTHER): Payer: Self-pay | Admitting: Adult Congenital Heart Disease

## 2017-11-03 ENCOUNTER — Encounter (HOSPITAL_BASED_OUTPATIENT_CLINIC_OR_DEPARTMENT_OTHER): Payer: Self-pay | Admitting: Adult Congenital Heart Disease

## 2017-11-03 NOTE — Telephone Encounter (Signed)
Called and spoke to patient on 10/06/17 and 10/21/17 in regards to rescheduling appointment with Dr. Fran LowesKrieger on 02/23/18, since Dr. Fran LowesKrieger will be out of the clinic on that date. Patient stated during both calls that she would call back later to reschedule the appointment. Attempted to contact patient on 11/03/17 to follow up on rescheduling the appointment. Patient did not answer and the mailbox was full so I was unable to leave a message.     Since patient previously stated that she would call back to reschedule her appointment, we will cancel the appointment and await her call. Will send a reminder letter for patient to call back for rescheduling.

## 2018-02-14 ENCOUNTER — Encounter (HOSPITAL_BASED_OUTPATIENT_CLINIC_OR_DEPARTMENT_OTHER)

## 2018-02-23 ENCOUNTER — Encounter (HOSPITAL_BASED_OUTPATIENT_CLINIC_OR_DEPARTMENT_OTHER)

## 2021-11-05 IMAGING — OT DXA BONE DENSITY
2 series · 2 of 2 positions shown · non-contrast
Comparison: none

REASON FOR EXAM: Postmenopausal osteoporosis screening.

RISK FACTORS:  None.
PRIOR EXAMS:  None.
METHOD:  Scans of the spine and hip were performed using dual energy X-ray densitometry (DXA)

[Series 1: — · left · 1 of 1 slices shown (1 of 2)]
[im 1/1]
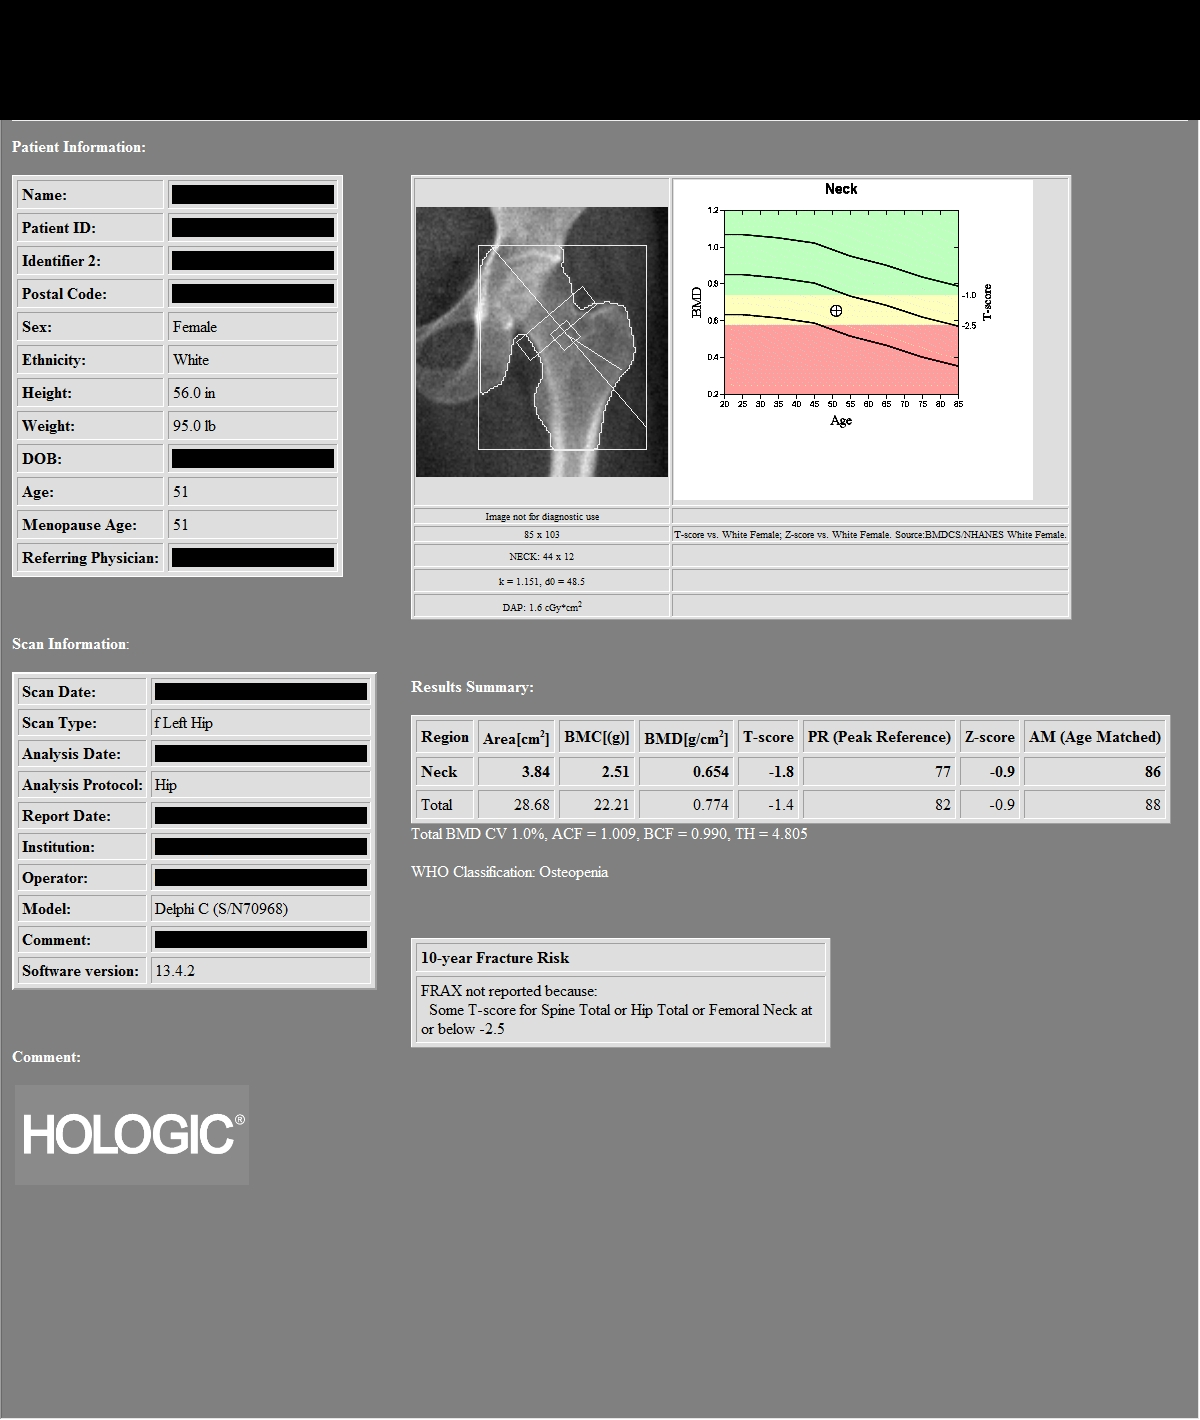

[Series 2: — · 1 of 1 slices shown (2 of 2)]
[im 1/1]
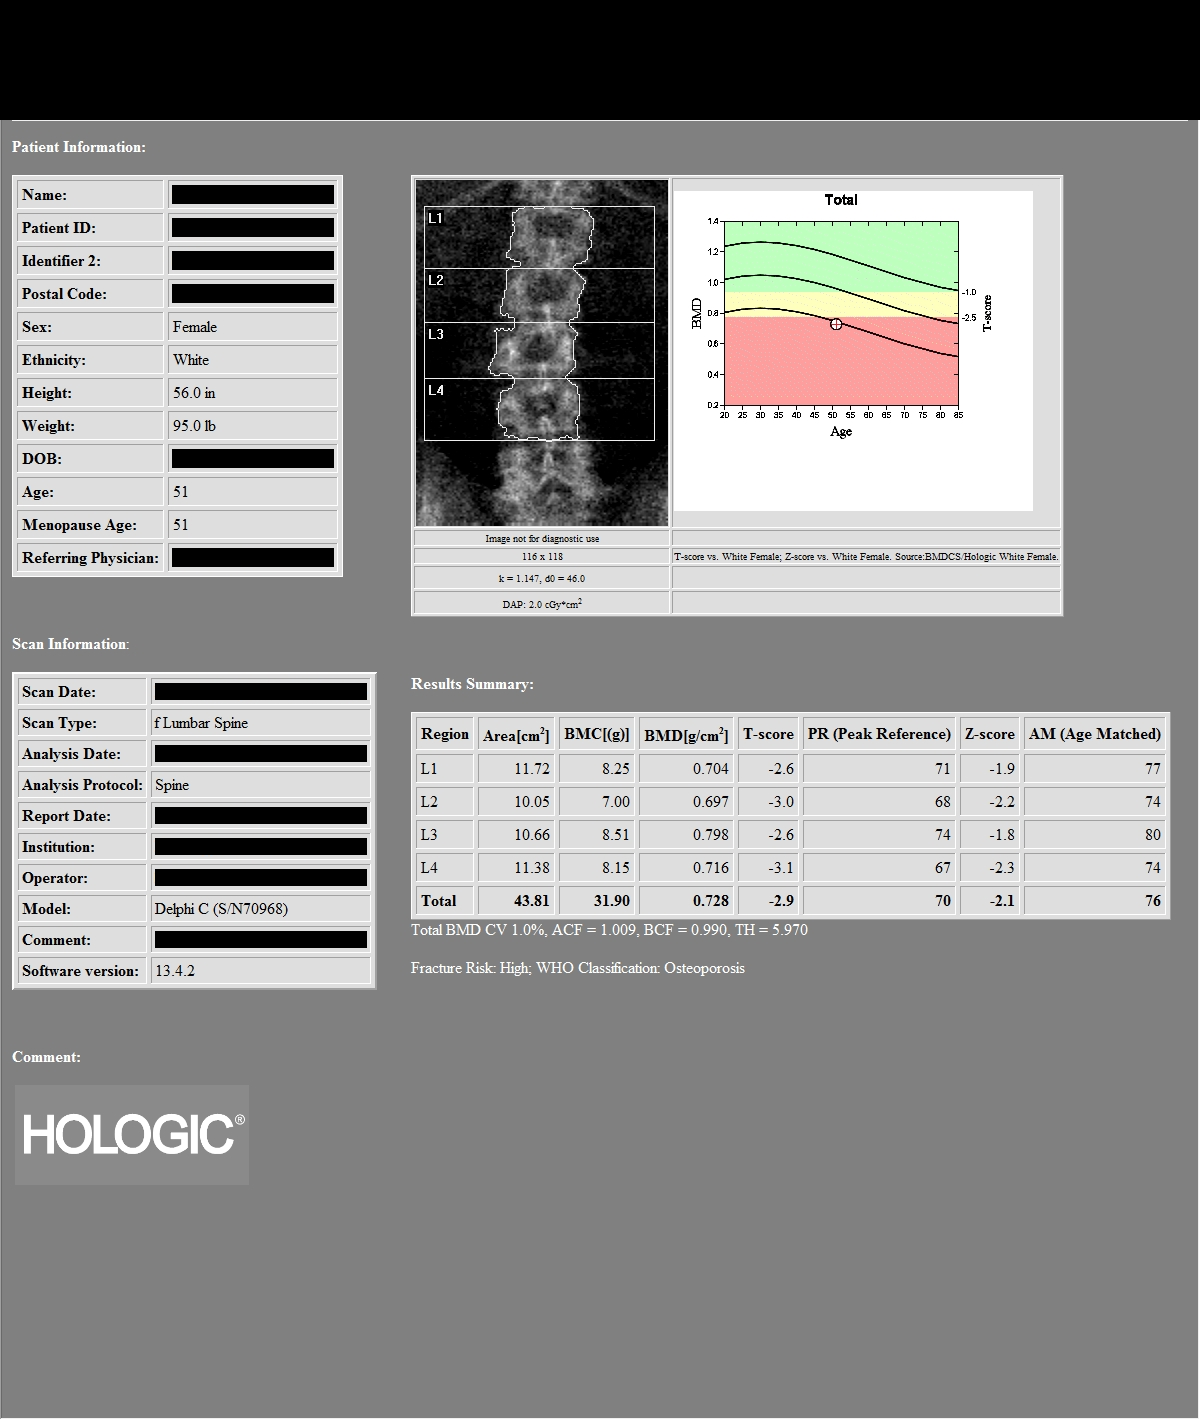

[2 of 2 positions shown; findings below may reference images not displayed]

IMPRESSION: As defined by World Health Organization, the patient meets the criteria for OSTEOPOROSIS based on spine T-score.

PATIENT DEMOGRAPHICS:  50-year-old white female.
FINDINGS: 1.    Review of scanogram images shows mild right spinal curvature and spinal arthritis. No vertebra is excluded from spine analysis.  

2.    The lumbar spine exam using L1-L4 regions shows average Bone Mineral Density is 0.728 gm/cm2 of Hydroxyapatite.  The T-score (comparing patient with a young adult group) is 2.9 standard deviations BELOW mean. The Z-score (comparing patient with an age-matched group) is 2.1 standard deviations BELOW mean.

3.  The left hip exam using femoral neck region of interest shows average Bone Mineral Density is 0.654 gm/cm2 of Hydroxyapatite. The T-score (comparing patient with a young adult group) is 1.8 standard deviations BELOW mean. The Z-score (comparing patient with an age-matched group) is 0.9 standard deviations BELOW mean.

RECOMMENDATIONS:  The patient states that she is taking vitamin D supplements on a regular basis.  The patient should continue being a nonsmoker and  REGULAR EXERCISE TO PATIENT TOLERANCE WOULD BE OF BENEFIT. THE PATIENT IS CURRENTLY NOT TAKING PRESCRIBED MEDICATION FOR PREVENTION OF BONE LOSS. According to criteria established by the National Osteoporosis Foundation, THE PATIENT DOES MEET THE CURRENT INDICATIONS FOR PRESCRIBED MEDICAL THERAPY. The National Osteoporosis Foundation now recommends followup DXA scanning every two years in patients at risk regardless of whether the patient is undergoing pharmacological treatment.

World Health Organization criteria for diagnosis, please see link below.

[URL]

## 2021-11-05 IMAGING — MG MAMMO DIAG BIL W/CAD TOMO
6 of 10 series · 6 of 26 positions shown · non-contrast
Comparison: The present examination has been compared to prior imaging studies.

HISTORY: Patient is 50 years old and is seen for diagnostic evaluation of calcifications in the left breast. The patient has a history of bilateral ultrasound guided core biopsy in 10/10/2019 - fibroadenoma and left cyst aspiration in 10/10/2019 - benign.
TECHNIQUE: Bilateral 2-D digital diagnostic mammogram was performed followed by 3-D tomosynthesis.  Current study was also evaluated with a computer aided detection (CAD) system.

[L CC (1 of 2)]
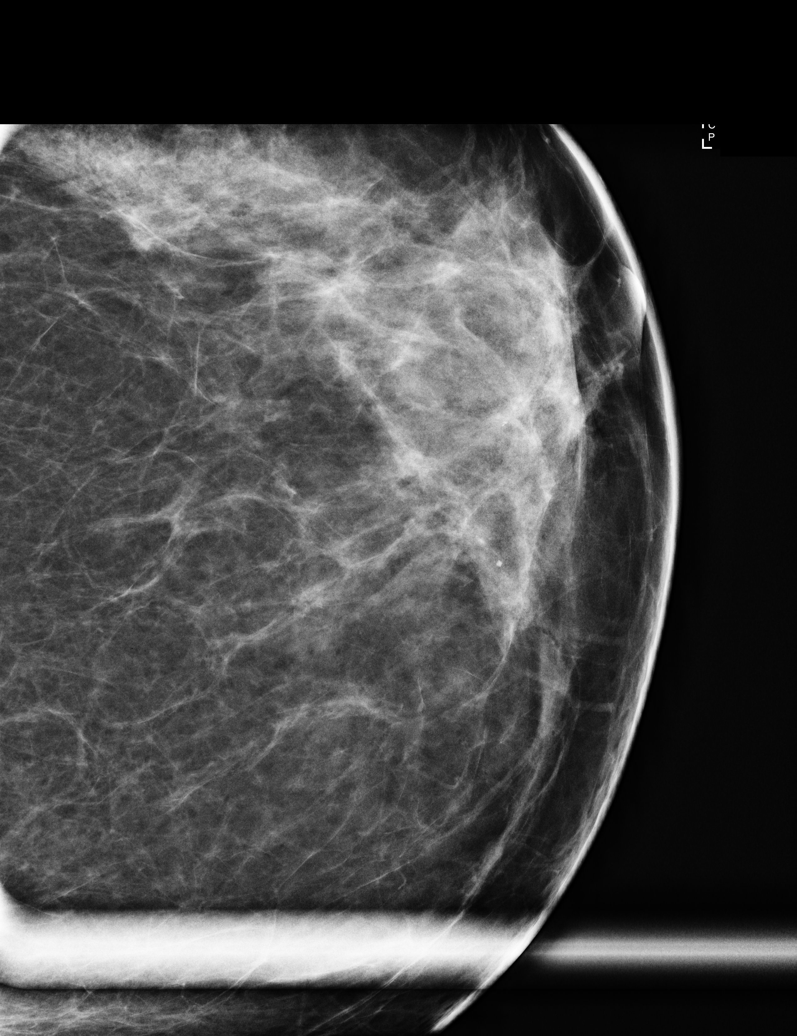

[L ML]
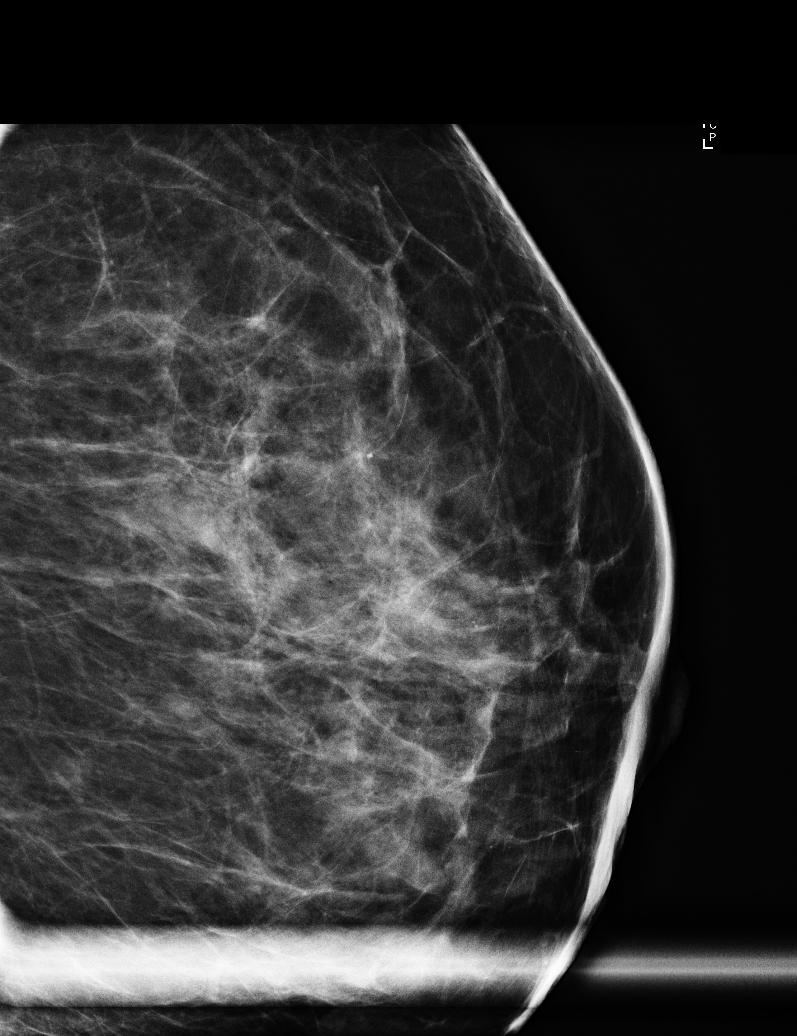

[R CC]
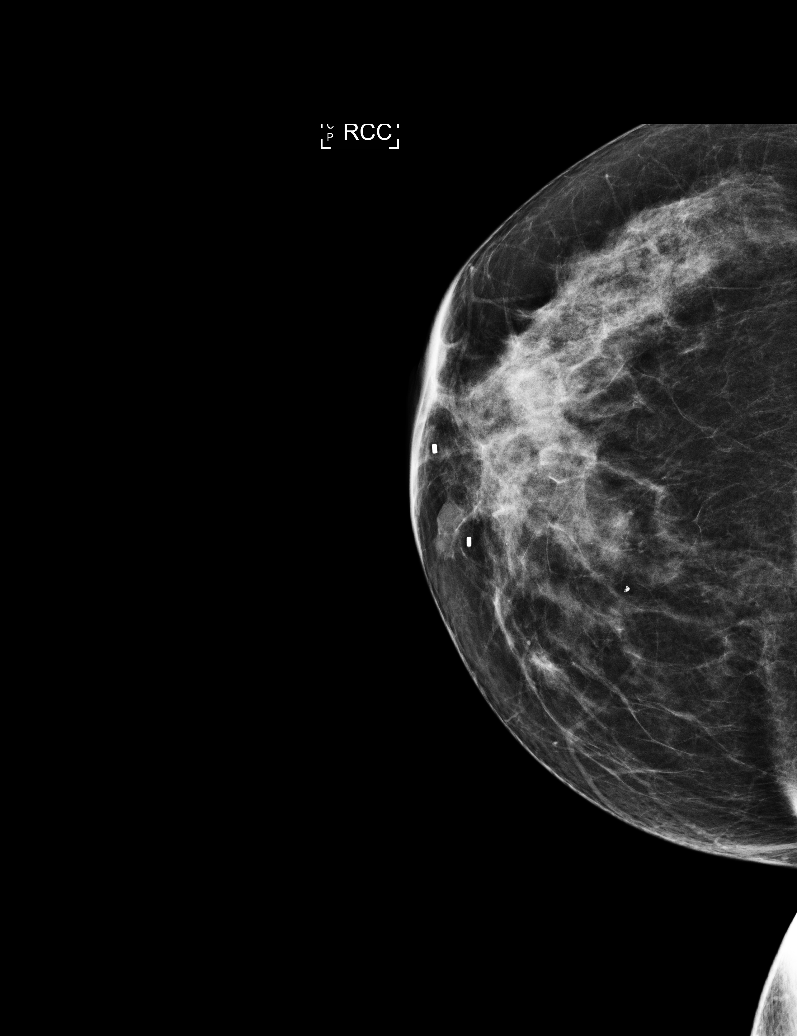

[L CC (2 of 2)]
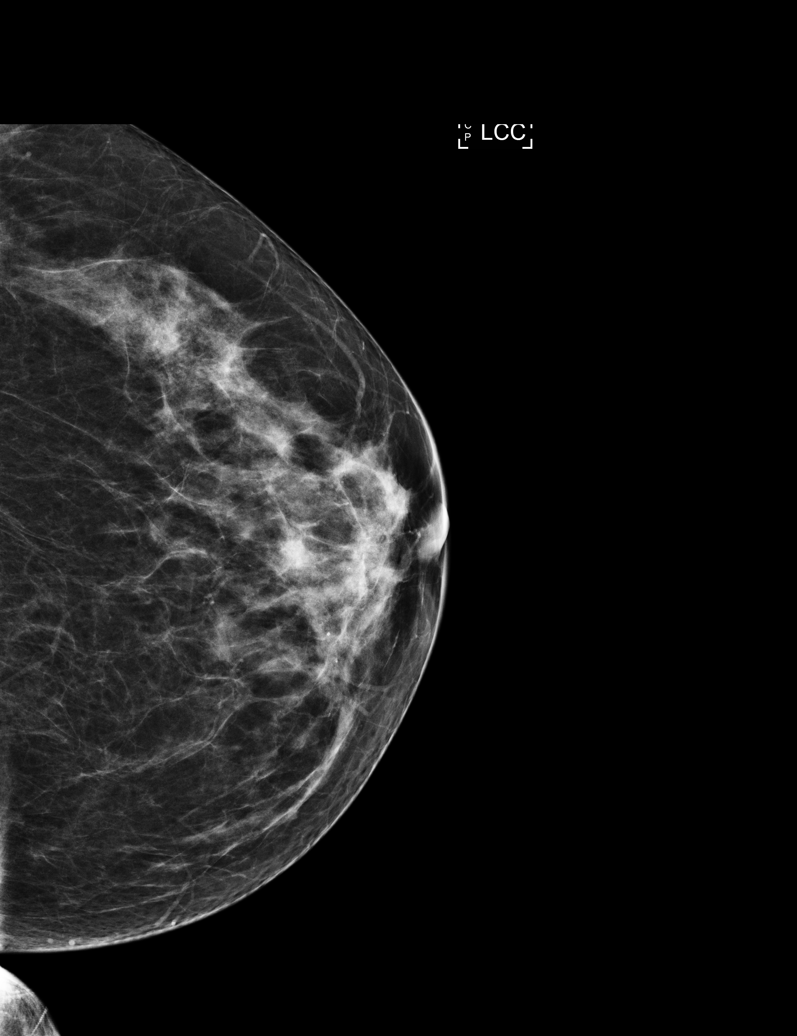

[L MLO]
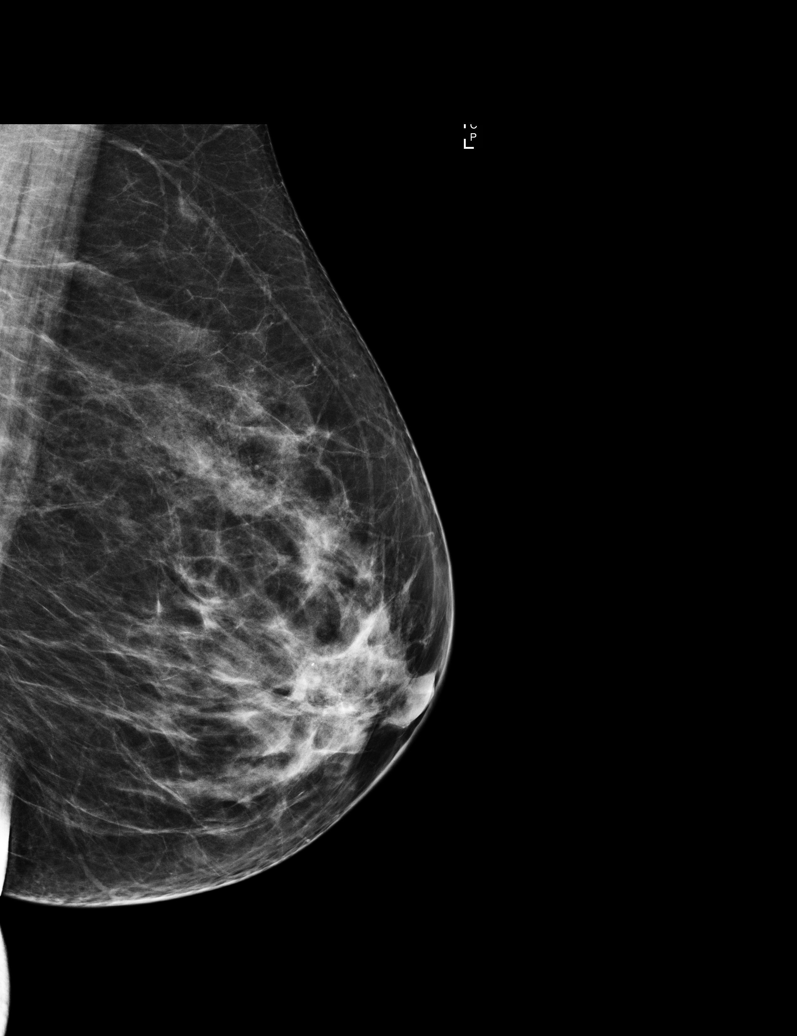

[R MLO]
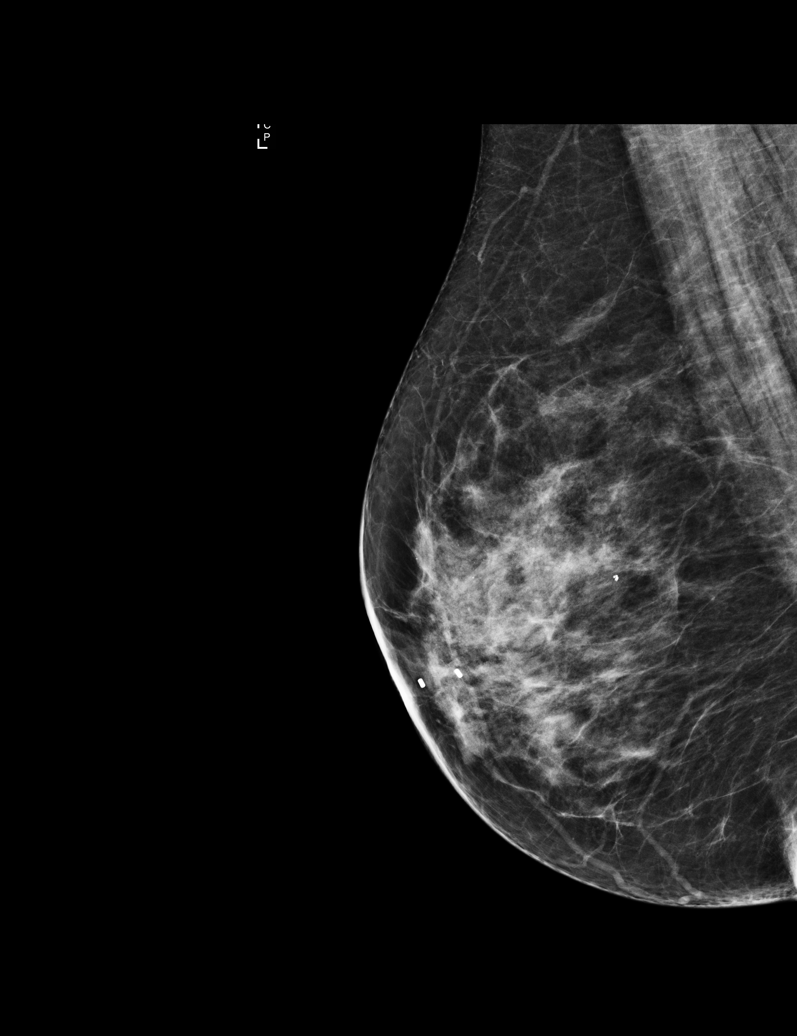

[6 of 26 positions shown; findings below may reference images not displayed]

MAMMOGRAM FINDINGS:

The breasts are heterogeneously dense, which may obscure small masses.

There are stable calcifications in both breasts. The previously magnified calcifications in the anterior left breast appears stable on comparison magnification views.

No suspicious abnormality is seen in either breast.
IMPRESSION: There is no mammographic evidence of malignancy.

A return to screening in 1 year is recommended.

The patient was given a copy of her results at the time of her visit.

BI-RADS Category 2: Benign

## 2021-12-17 IMAGING — CR L-SPINE 4 VWS MIN
1 series · 5 of 5 positions shown · non-contrast
Comparison: None

HISTORY: Low back pain.
TECHNIQUE: Lumbar spine, 5 views

[Series 1: t lumbar spine ap · 0.15mm/px · 5 of 5 slices shown]
[im 1/5]
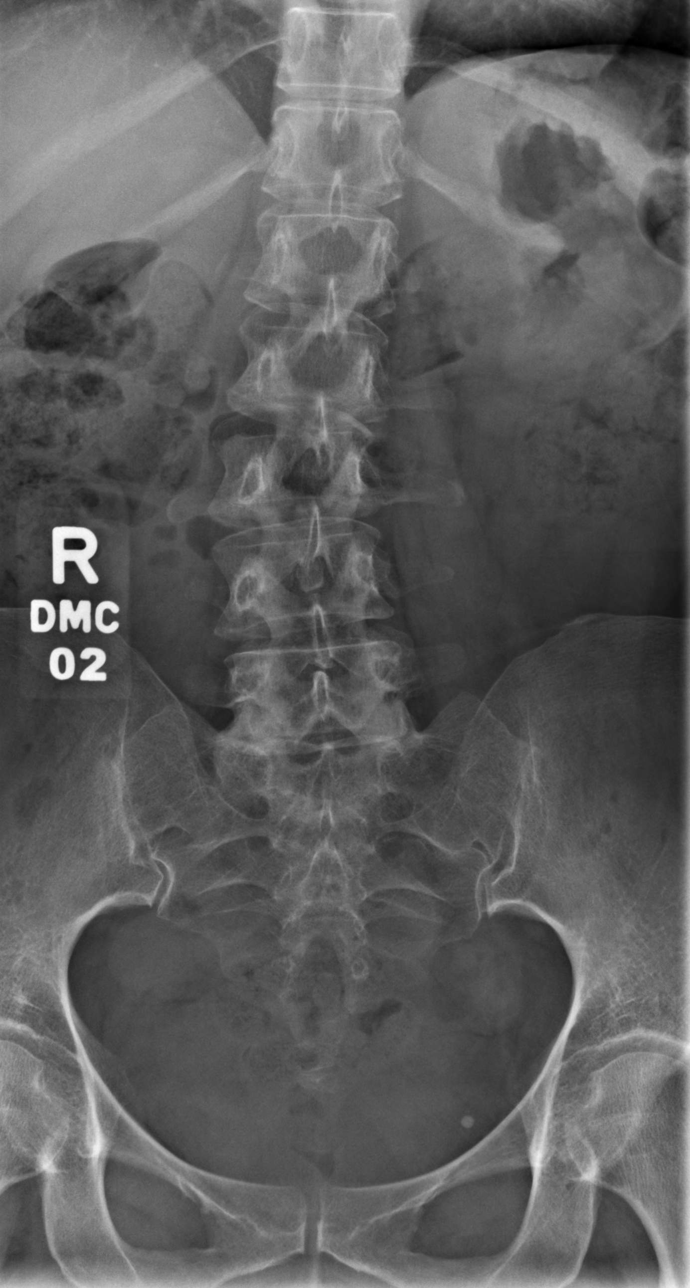
[im 2/5]
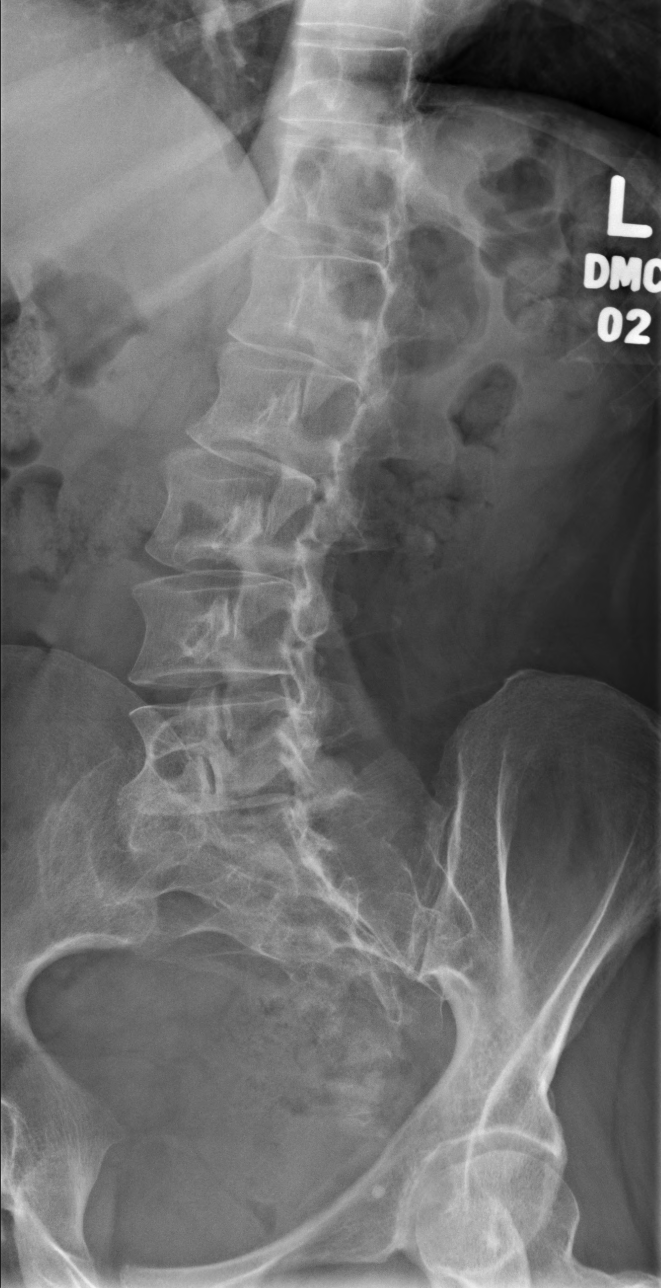
[im 3/5]
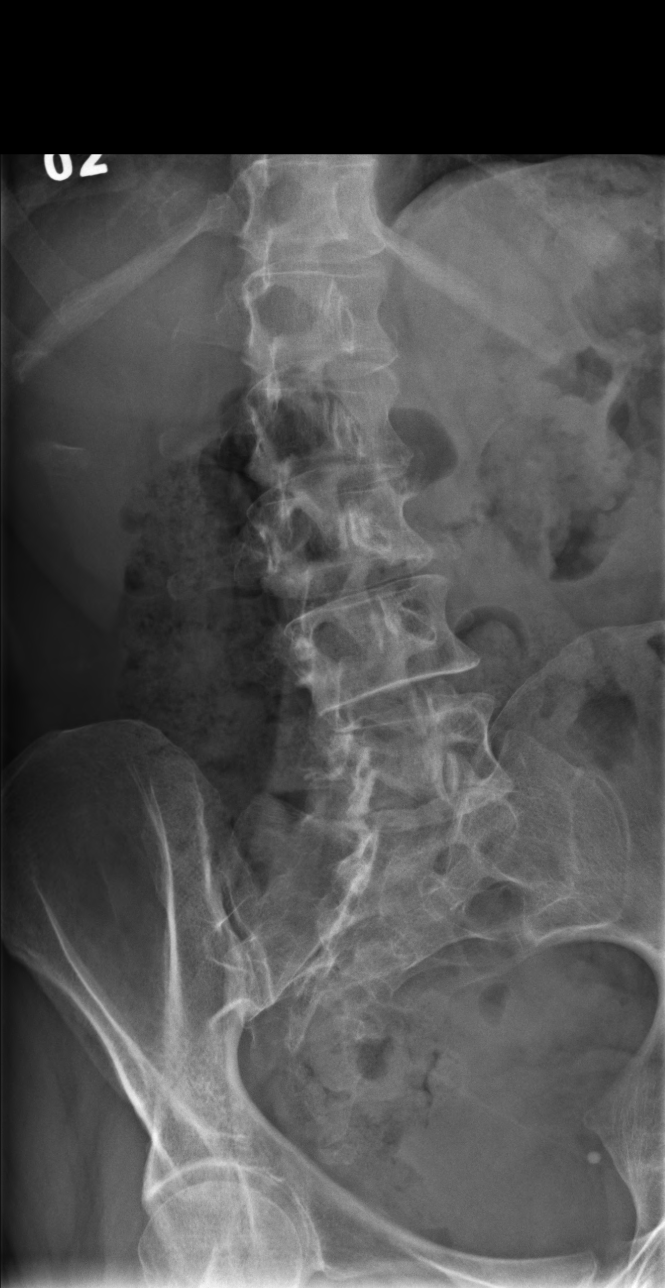
[im 4/5]
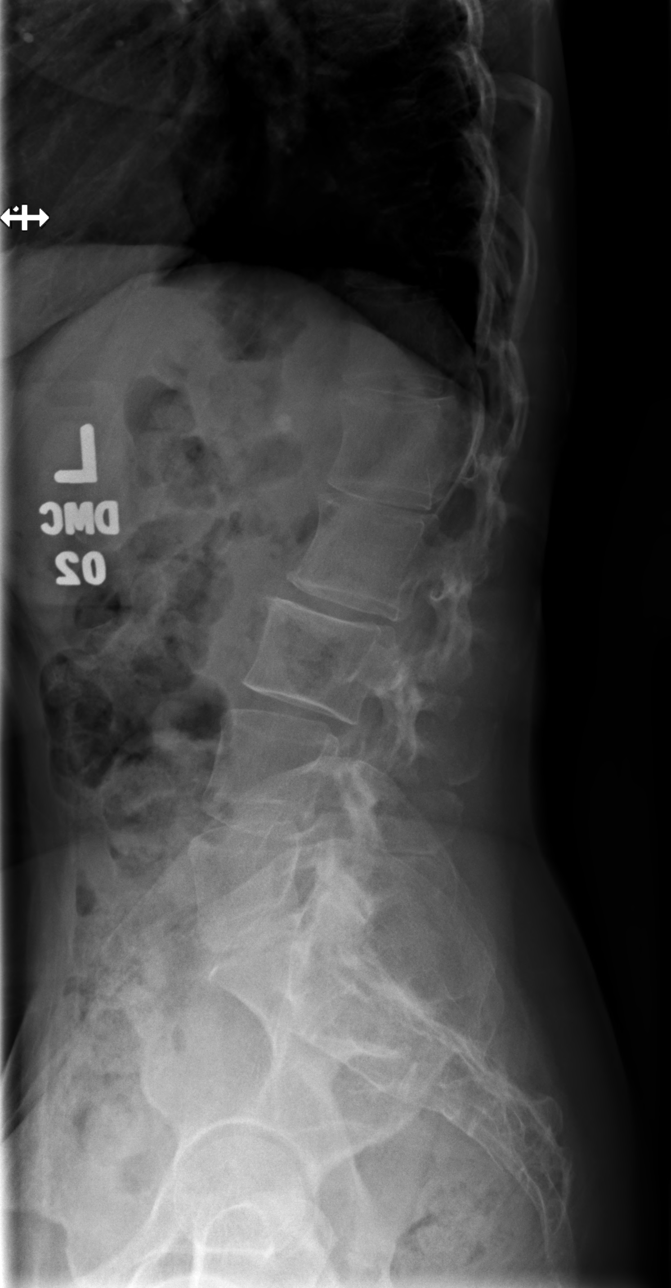
[im 5/5]
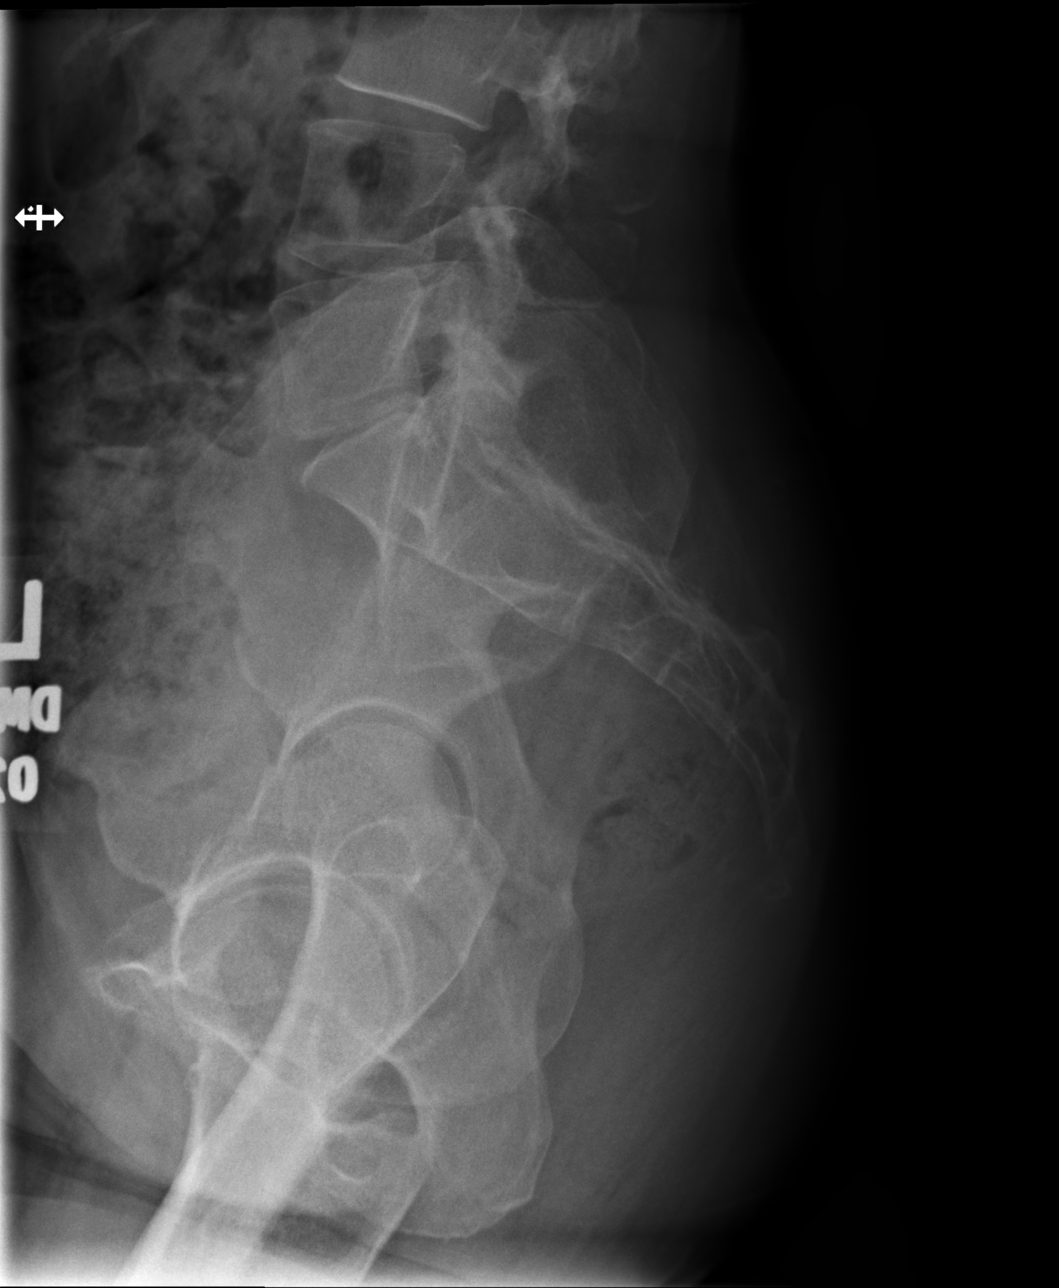

[5 of 5 positions shown; findings below may reference images not displayed]

FINDINGS: 5 views of the lumbar spine demonstrate generalized demineralization. No fractures. No destructive lesions. Mild dextroscoliosis. Mild loss of normal lumbar lordosis in the upper lumbar region. Grade 1 retrolistheses at L2-3 and L3-4. Mild degenerative changes of the SI joints. Paravertebral soft tissues are unremarkable.
IMPRESSION: Demineralization without fracture. Mild dextroscoliosis. Grade 1 retrolistheses at L2-3 and L3-4.

## 2021-12-17 IMAGING — CR PELVIS 1-2 VWS
1 series · 1 of 1 positions shown · non-contrast
Comparison: None available.

INDICATION: Pelvic pain.
TECHNIQUE: Single AP view of the pelvis.

[t pelvis ap]
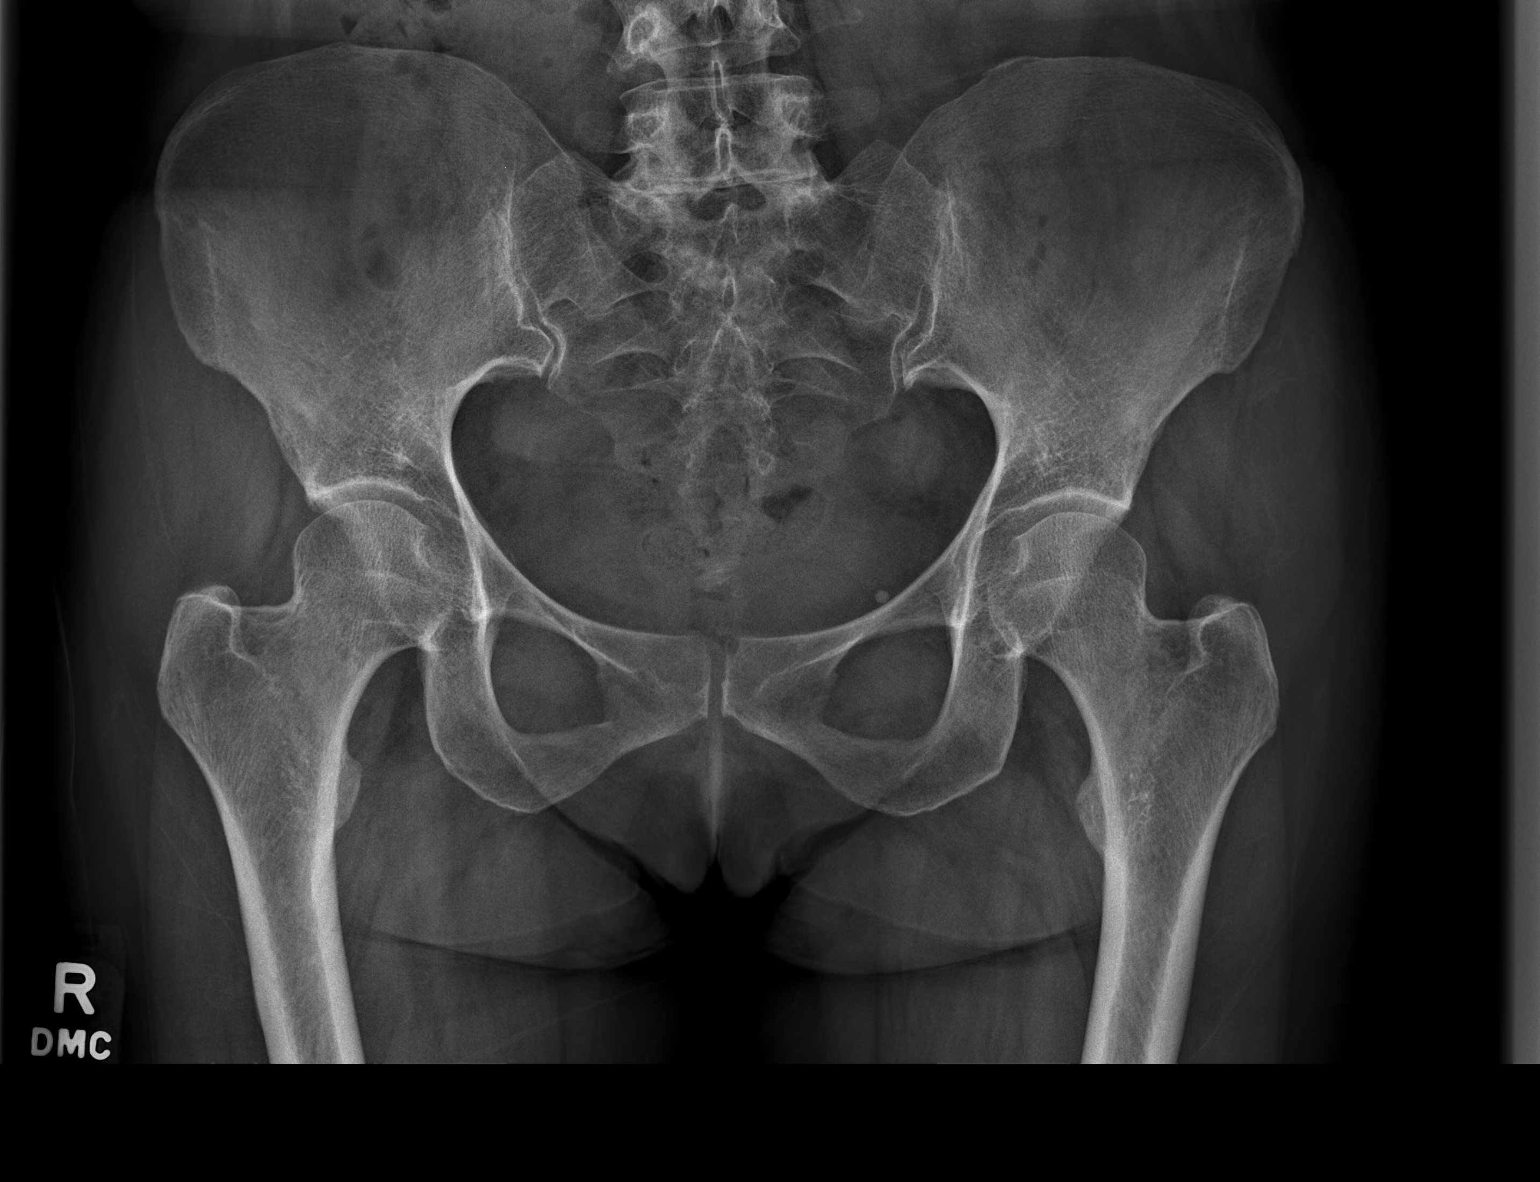

[1 of 1 positions shown; findings below may reference images not displayed]

FINDINGS: No acute fracture or dislocation. Bilateral hip joint spaces are intact. The pubic symphysis and bilateral sacroiliac joints are unremarkable in appearance. The visualized bowel gas pattern is unremarkable.
IMPRESSION: Unremarkable appearance of the pelvis.

## 2022-01-29 IMAGING — CT CT THORAX WO CONTRAST
2 of 5 series · 11 of 36 positions shown, 13 images · non-contrast
Comparison: None.

HISTORY: 50-year-old female with congenital malformations of great veins. Aneurysm of iliac artery.
TECHNIQUE: CT chest study was performed without IV contrast using axial images. Sagittal and coronal reconstructed images were obtained. Dose reduction technique used:  Automated exposure control and adjustment of the mA and/or kV according to patient size. CT studies and Cardiac Nuclear Medicine Studies in the last 12 months: 0.

Total radiation dose to patient is CTDIvol 3.65 mGy and DLP 127.00 mGy-cm.

[Series 4: coronal · coronal · 0.60mm/px · 3 of 41 slices shown]
[im 9/41  lung]
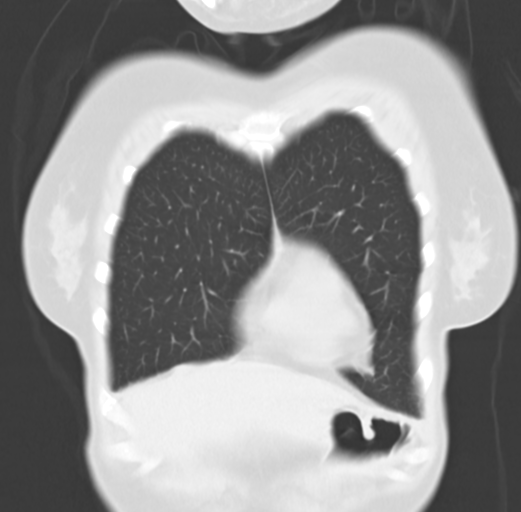
[im 17/41  lung]
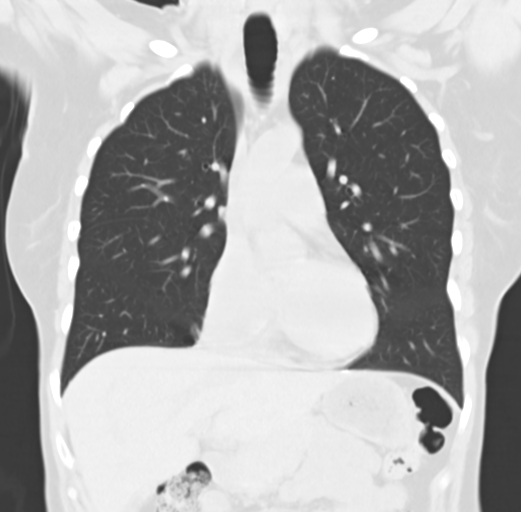
[im 25/41  lung]
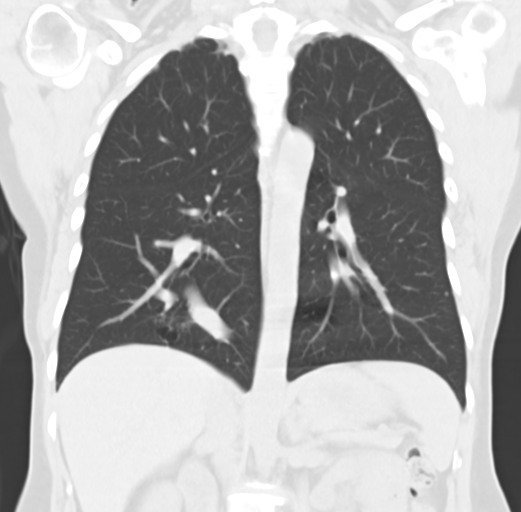

[Series 8: lung · axial · 0.43mm/px · z∈[+1534,+1774]mm · 8 of 154 slices shown, 10 images]
[im 17/154  mediastinal]
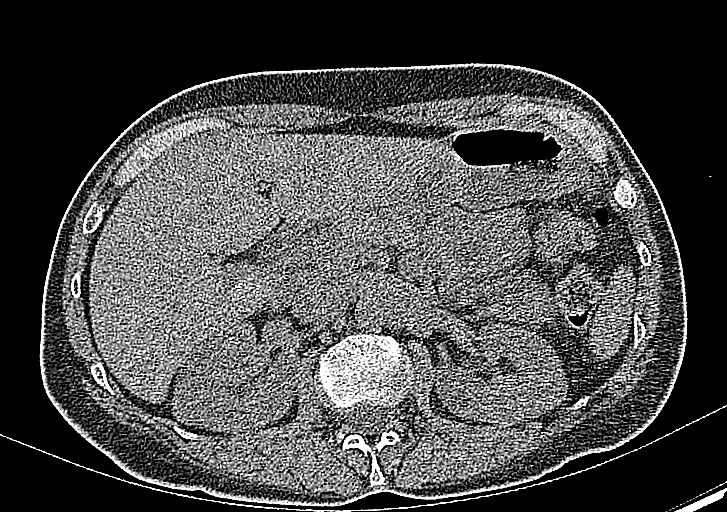
[im 17/154  lung]
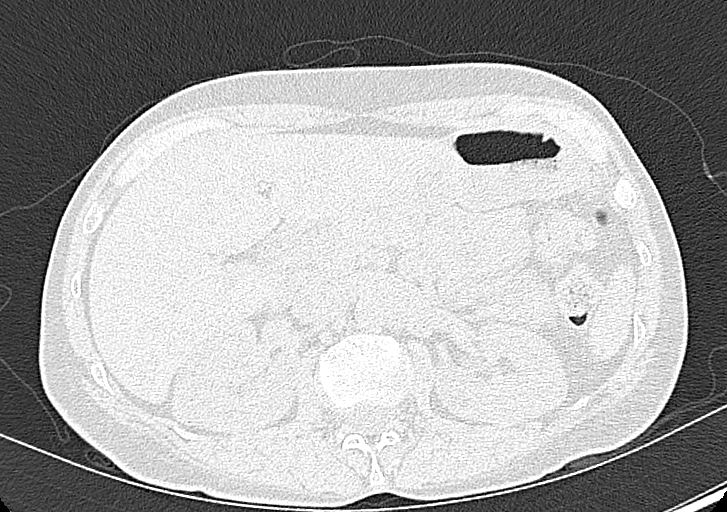
[im 33/154  lung]
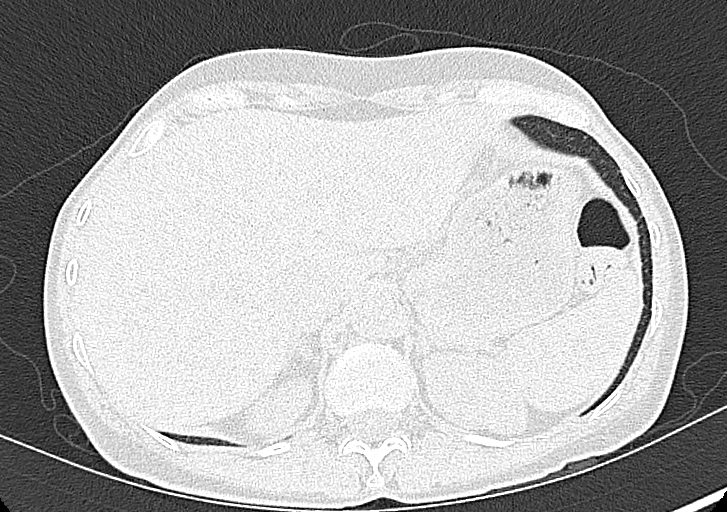
[im 49/154  lung]
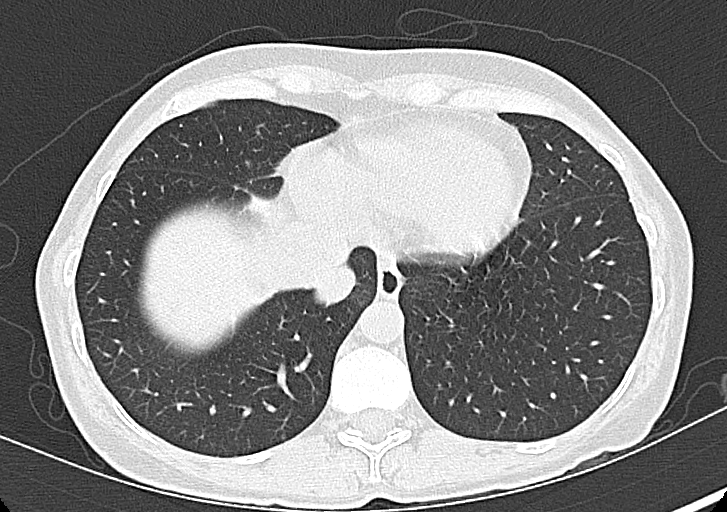
[im 65/154  lung]
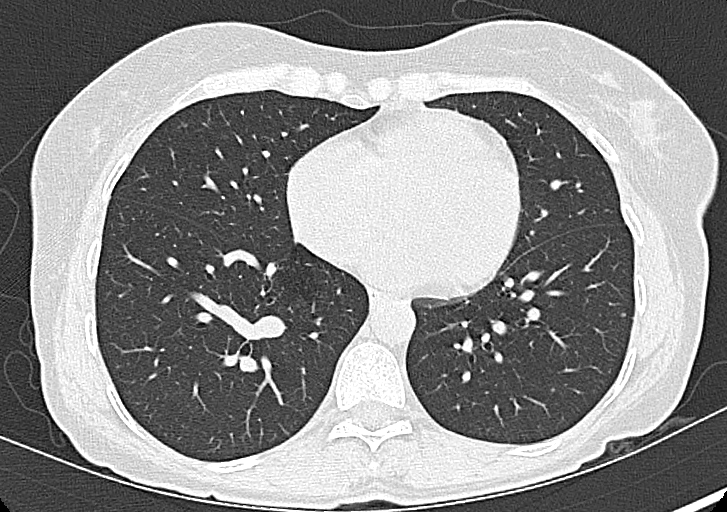
[im 89/154  mediastinal]
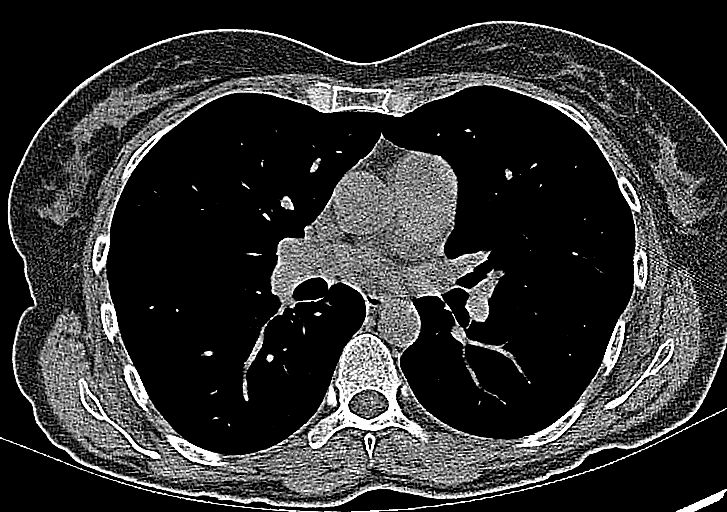
[im 89/154  lung]
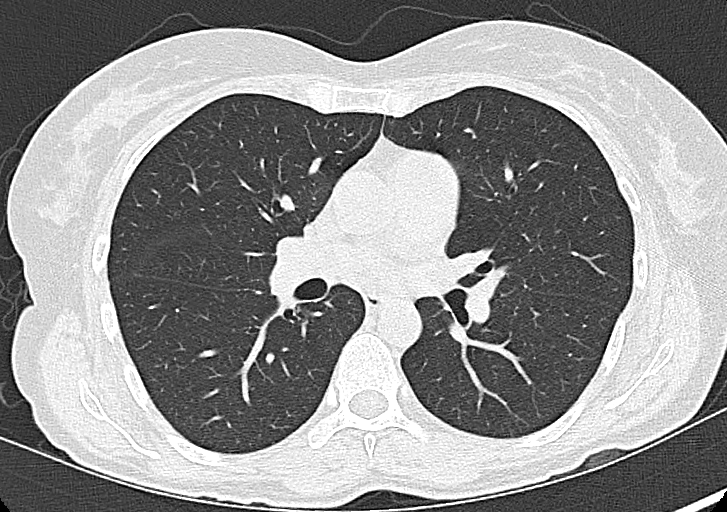
[im 105/154  lung]
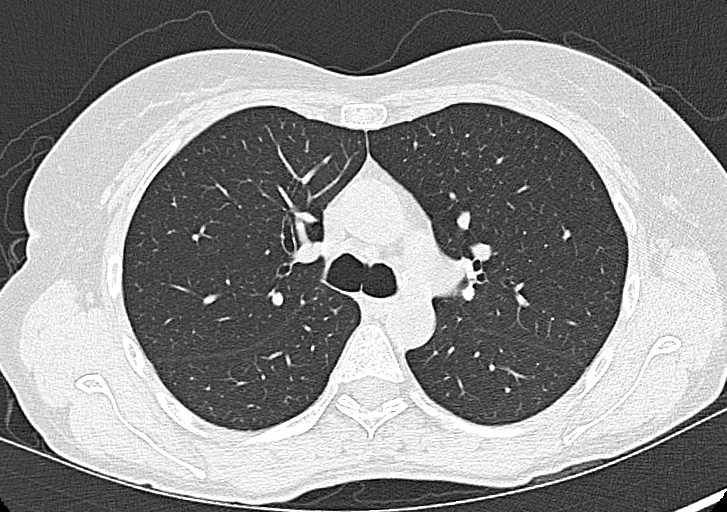
[im 121/154  lung]
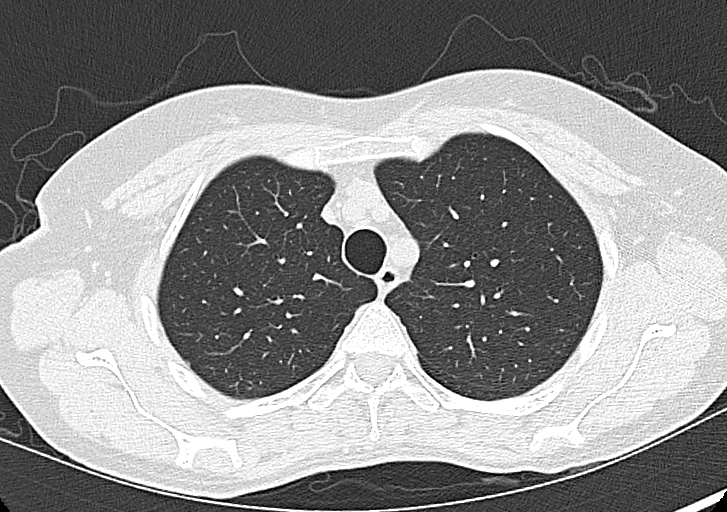
[im 137/154  lung]
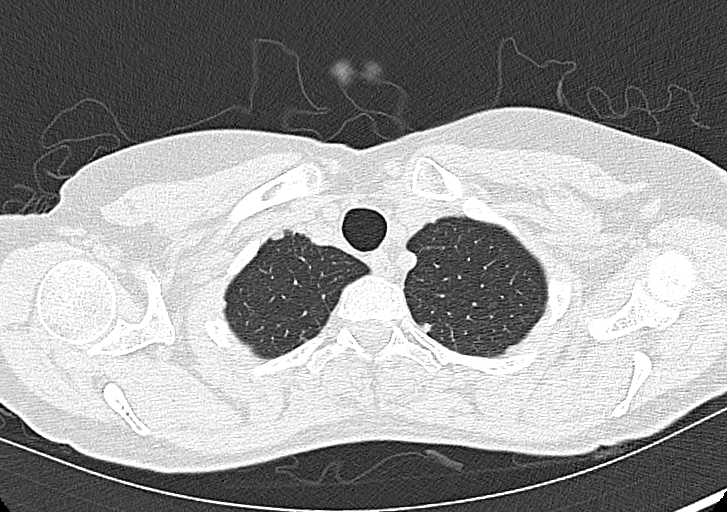

[11 of 36 positions shown; findings below may reference images not displayed]

FINDINGS: Airways and Lungs: Small, less than 5 mm nodules are present in the right lung ([DATE], 47, 82), left lung ([DATE]).

Pleura: No pleural effusion or pneumothorax.

Heart and Mediastinum: Normal heart size. Coronary artery calcifications: Absent. No pericardial effusion. The distal esophagus is unremarkable.

Thoracic Aorta: Normal caliber. No significant atherosclerotic calcifications. No evidence of dissection. 

Lymph nodes: Calcifications are identified in the AP window and left paratracheal lymph notes.

Lower Neck: Unremarkable.

Chest Wall: Normal.

Upper Abdomen: Calcified granuloma is identified in the spleen.

Bones: The bones are unremarkable.
IMPRESSION: 1.
Small less than 5 mm nodules identified in both lungs.

2.
Calcifications are identified in the AP window and left paratracheal lymph nodes. Old granulomatous disease is a consideration.

3.
The remaining chest is unremarkable.

Pulmonary nodule recommendation: No followup needed if patient is low-risk (and has no known or suspected primary neoplasm). Non-contrast chest CT can be considered in 12 months if patient is high risk.

## 2022-02-09 IMAGING — MR MRA ABDOMEN WO/W CONTRAST
10 series · 40 of 40 positions shown · IV contrast (20cc Prohance)
Comparison: None

HISTORY: 50 year-old female with aneurysm of iliac artery.
TECHNIQUE: MR angiography study of the abdomen was performed first without contrast followed by IV administration of 20 cc ProHance. Sagittal and coronal and axial images of the various sequences were obtained. 2-D and 3-D reconstructed images were also obtained. Post-processing software generated rotating 3-D and MIP images also obtained.

[Series 2: t2_haste_coronal · coronal · 6.0mm · 1.17mm/px · 1 of 26 slices shown]
[im 1/26]
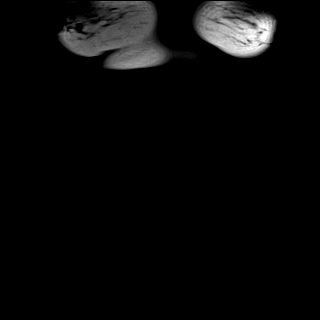

[Series 4: t1_3d_vibe_axial_(person_name)_in · axial · 3.0mm · 1.33mm/px · z∈[-110,+223]mm · 5 of 112 slices shown]
[im 1/112]
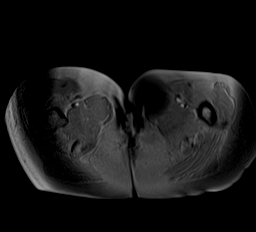
[im 28/112]
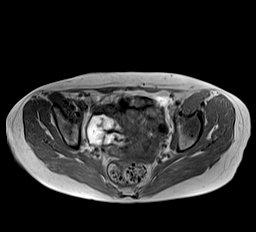
[im 56/112]
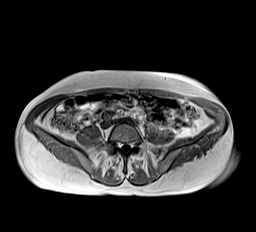
[im 84/112]
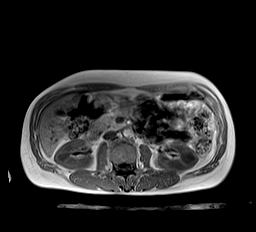
[im 112/112]
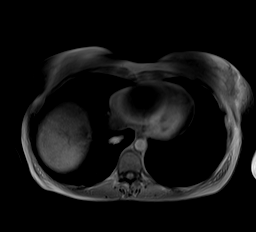

[Series 6: t1_3d_vibe_axial_(person_name)_(person_name) · axial · 3.0mm · 1.33mm/px · z∈[-110,+223]mm · 6 of 112 slices shown]
[im 1/112]
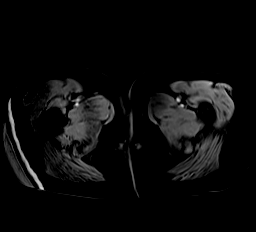
[im 23/112]
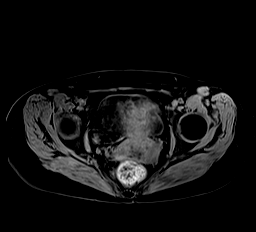
[im 45/112]
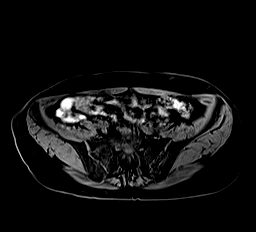
[im 67/112]
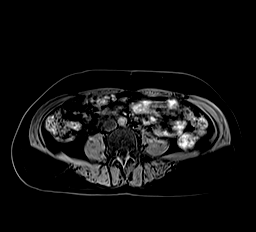
[im 89/112]
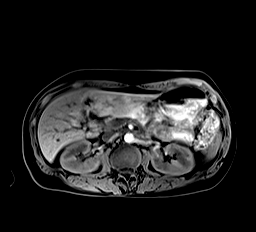
[im 112/112]
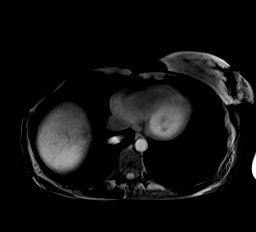

[Series 10: fl3d_ce_cor_+c_moco-std · coronal · 1.2mm · 1.17mm/px · 5 of 104 slices shown (1 of 2)]
[im 1/104]
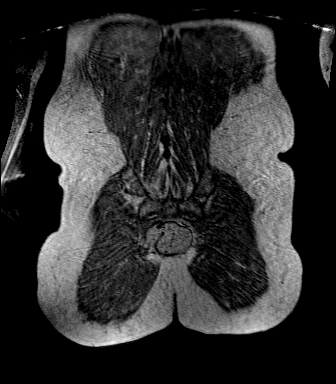
[im 26/104]
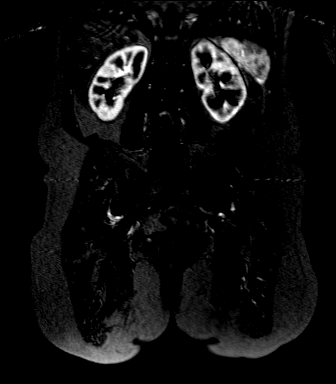
[im 52/104]
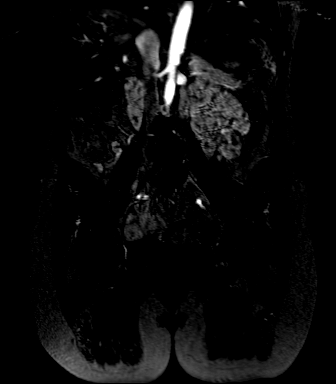
[im 78/104]
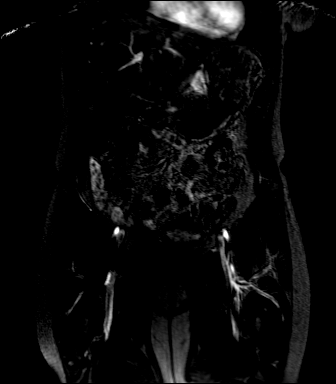
[im 104/104]
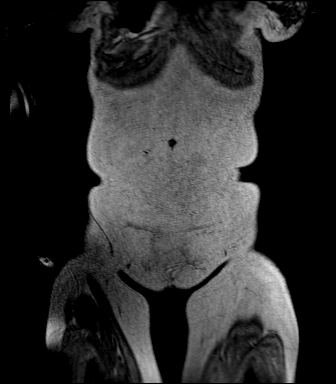

[Series 11: fl3d_ce_cor_+c_moco-std_sub · coronal · 1.2mm · 1.17mm/px · 5 of 103 slices shown (1 of 2)]
[im 1/103]
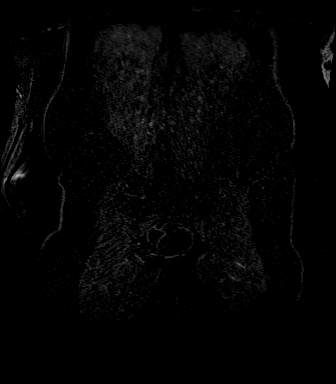
[im 26/103]
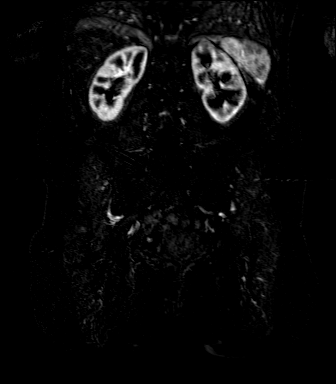
[im 52/103]
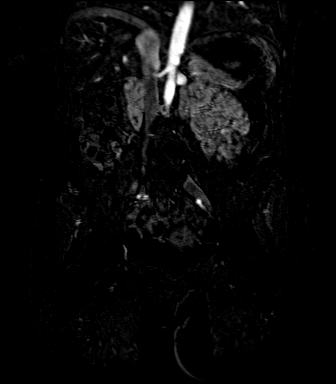
[im 77/103]
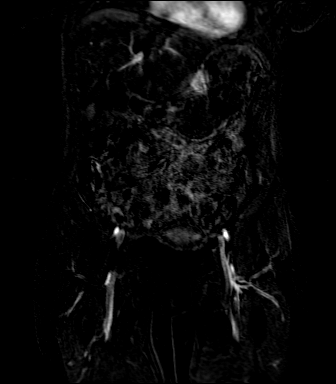
[im 103/103]
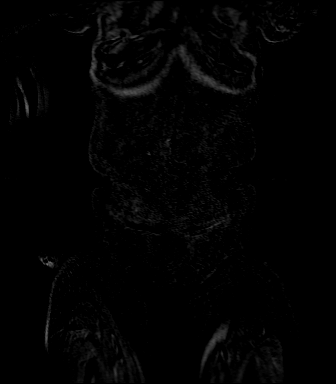

[Series 14: fl3d_ce_cor_+c_moco-std · coronal · 1.2mm · 1.17mm/px · 5 of 104 slices shown (2 of 2)]
[im 1/104]
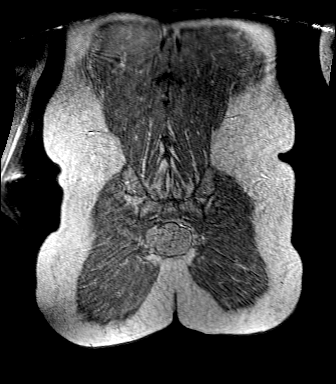
[im 26/104]
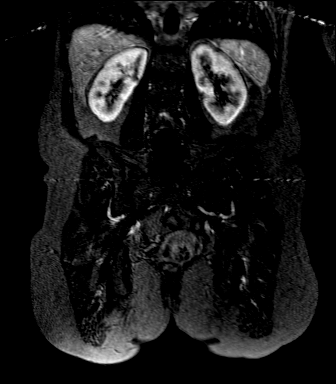
[im 52/104]
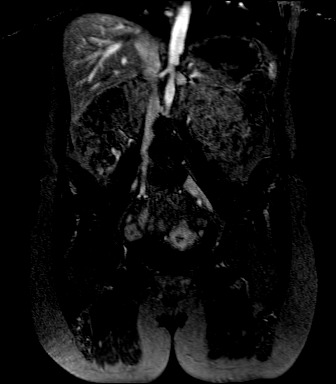
[im 78/104]
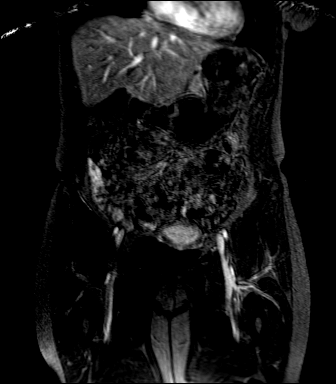
[im 104/104]
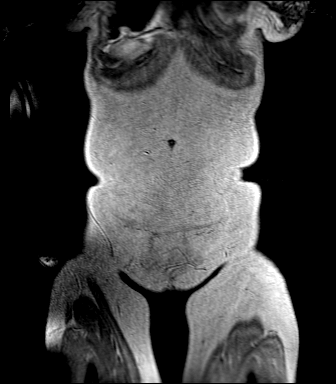

[Series 15: fl3d_ce_cor_+c_moco-std_sub · coronal · 1.2mm · 1.17mm/px · 5 of 104 slices shown (2 of 2)]
[im 1/104]
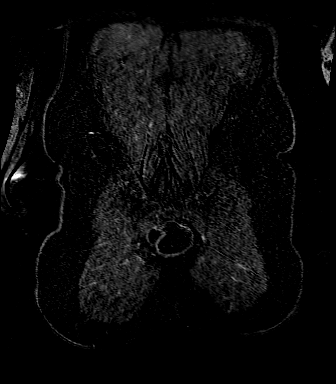
[im 26/104]
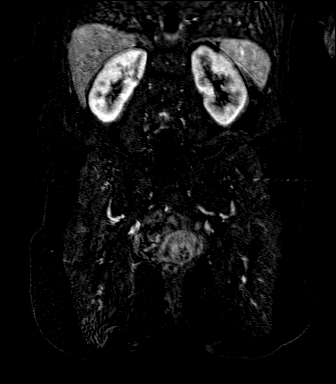
[im 52/104]
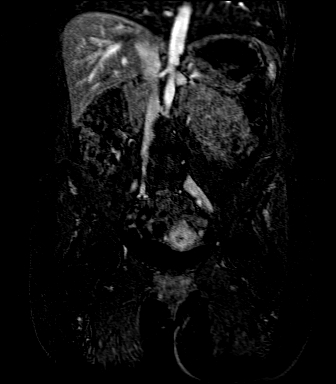
[im 78/104]
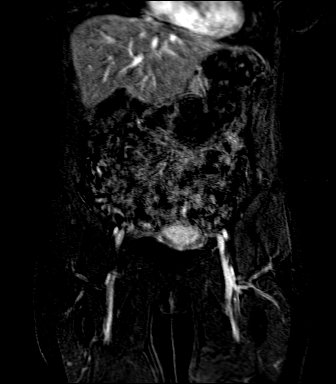
[im 104/104]
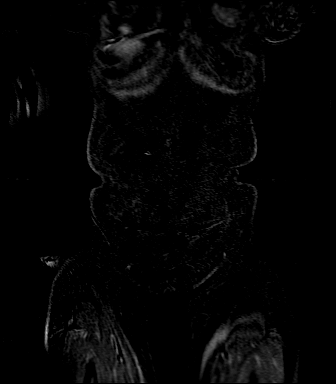

[Series 20: t1_3d_vibe_axial_(person_name)_+c_w · axial · 3.0mm · 1.33mm/px · z∈[-110,+223]mm · 6 of 112 slices shown]
[im 1/112]
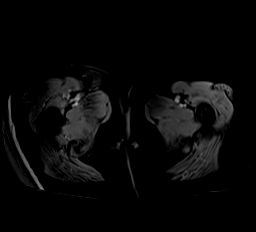
[im 23/112]
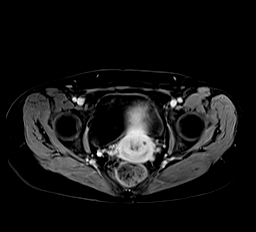
[im 45/112]
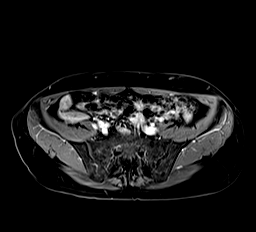
[im 67/112]
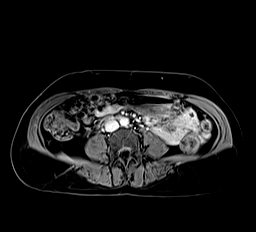
[im 89/112]
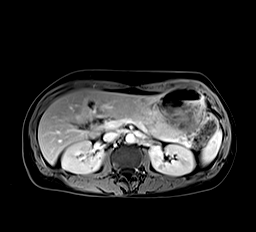
[im 112/112]
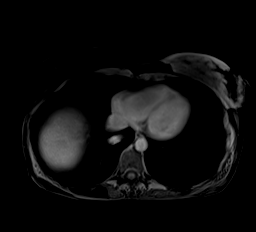

[Series 1048: MRA · sagittal · 1.2mm · 0.30mm/px · 1 of 13 slices shown (1 of 2)]
[im 1/13]
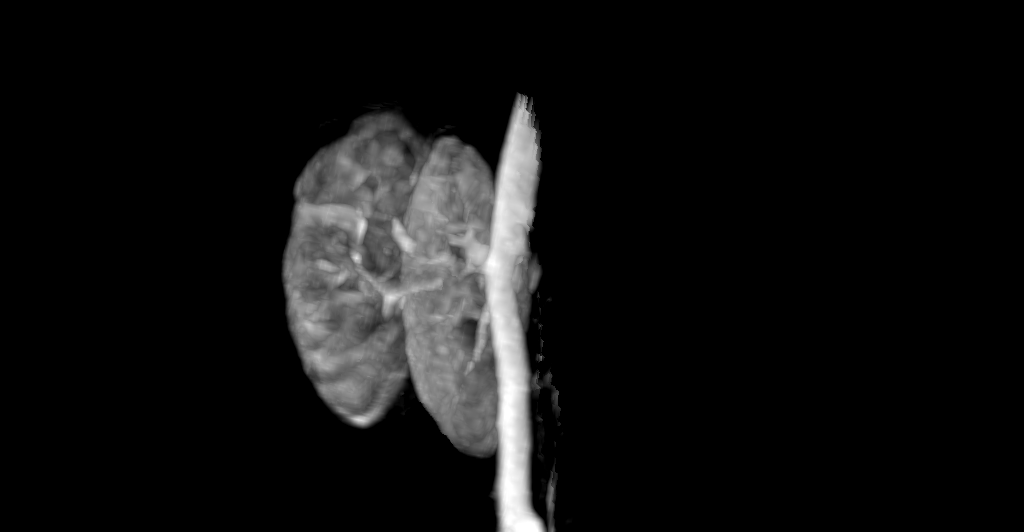

[Series 1054: MRA · coronal · 1.2mm · 0.52mm/px · 1 of 17 slices shown (2 of 2)]
[im 1/17]
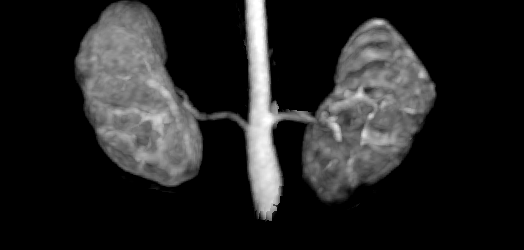

[40 of 40 positions shown; findings below may reference images not displayed]

FINDINGS: There is fusiform aneurysmal dilatation of the right common iliac artery starting at its origin and extending down 1 cm below the common iliac bifurcation, and slightly into the external iliac.

There are bilateral external iliac artery 50% stenoses, both about 2 cm below the bifurcation of the CIAs.

On the scout images, there is thickening of the endometrial complex 2 10 mm. Correlate with phase in cycle.

The right kidney measures maximally 8.0  cm in superior to inferior dimension. The left kidney measures 11.9  cm in maximum superior to inferior dimension.

Abdominal aortogram: The abdominal aorta, bilateral common iliac arteries, bilateral internal and external iliac arteries are unremarkable.

Renal arteries: The right renal artery is patent. The left renal artery is patent.

Celiac artery: The celiac artery is unremarkable.

Superior mesenteric artery: The superior mesenteric artery is patent.

Inferior mesenteric artery: The inferior mesenteric artery is within normal limits.

1 cm left parapelvic renal cyst.

2-D and 3-D reconstructed images confirm the above findings.
IMPRESSION: 1. Right common iliac fusiform aneurysm to 17 mm.

2. Unusual right pulmonary vein to hepatic vein anomalous drainage.

## 2022-02-09 IMAGING — MR MRA PELVIS WO/W CONTRAST
7 series · 40 of 40 positions shown · IV contrast (20cc Prohance)
Comparison: No priors other than today's MRA abdomen. Refer to that for incidental findings.

INDICATION: Aneurysm of iliac artery
TECHNIQUE: MRA without and with 20 mL ProHance IV, shared with the abdomen study, along with 2D and 3D MIP reformation images.

[Series 4: t1_3d_vibe_axial_(person_name)_in · axial · 3.0mm · 1.33mm/px · z∈[-110,+223]mm · 5 of 112 slices shown]
[im 1/112]
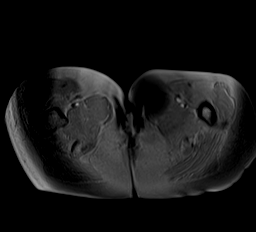
[im 28/112]
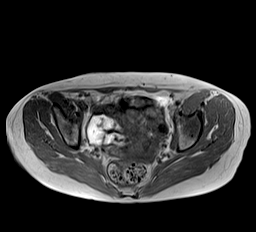
[im 56/112]
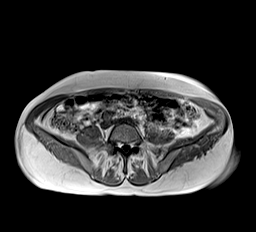
[im 84/112]
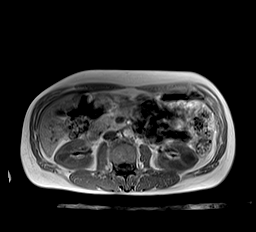
[im 112/112]
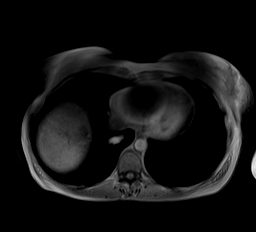

[Series 6: t1_3d_vibe_axial_(person_name)_(person_name) · axial · 3.0mm · 1.33mm/px · z∈[-110,+223]mm · 6 of 112 slices shown]
[im 1/112]
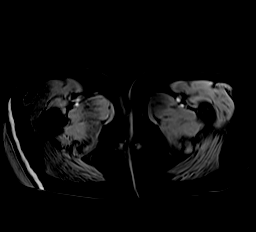
[im 23/112]
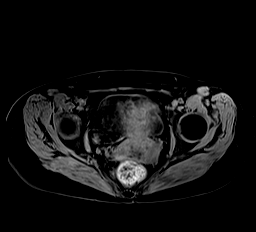
[im 45/112]
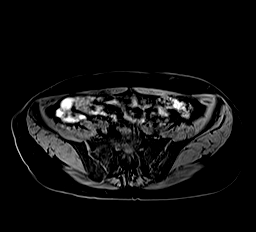
[im 67/112]
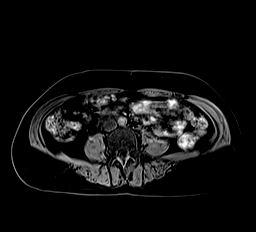
[im 89/112]
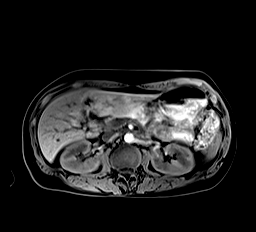
[im 112/112]
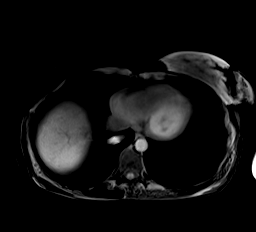

[Series 10: fl3d_ce_cor_+c_moco-std · coronal · 1.2mm · 1.17mm/px · 6 of 104 slices shown (1 of 2)]
[im 1/104]
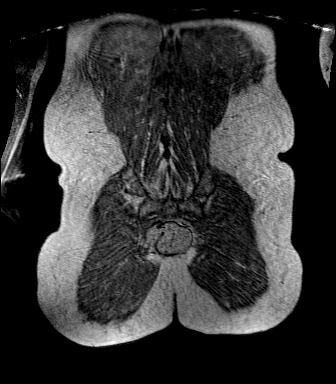
[im 21/104]
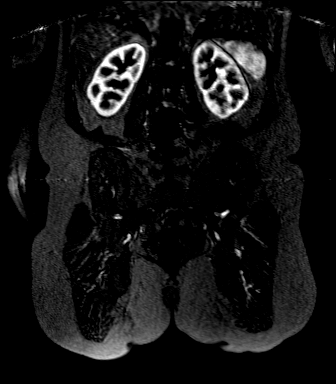
[im 42/104]
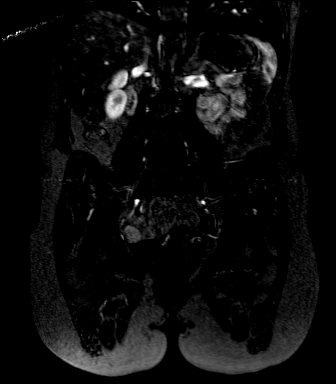
[im 62/104]
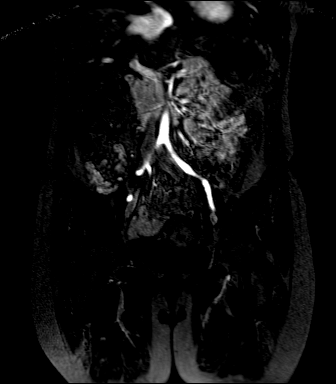
[im 83/104]
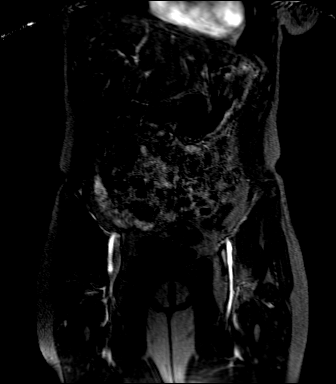
[im 104/104]
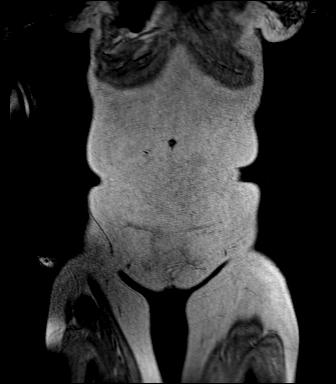

[Series 11: fl3d_ce_cor_+c_moco-std_sub · coronal · 1.2mm · 1.17mm/px · 5 of 103 slices shown (1 of 2)]
[im 1/103]
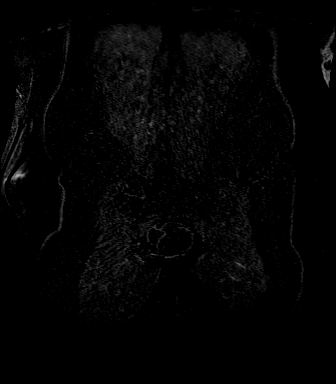
[im 26/103]
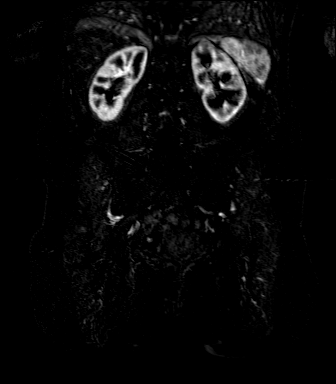
[im 52/103]
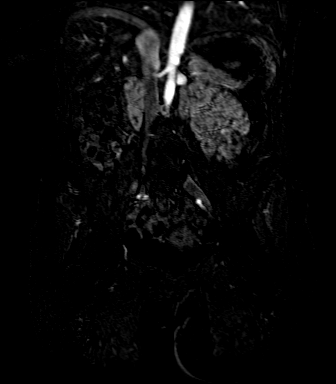
[im 77/103]
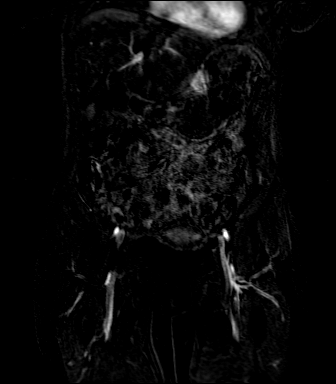
[im 103/103]
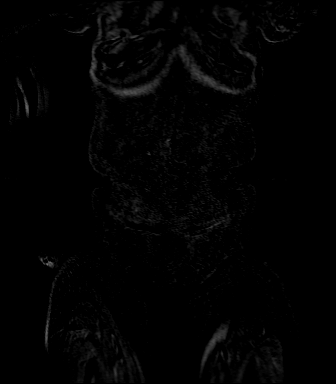

[Series 14: fl3d_ce_cor_+c_moco-std · coronal · 1.2mm · 1.17mm/px · 6 of 104 slices shown (2 of 2)]
[im 1/104]
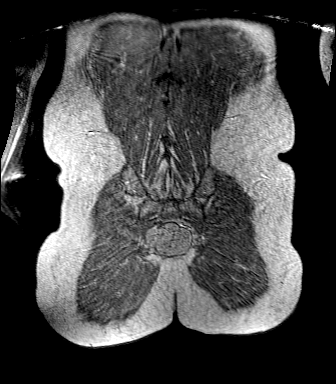
[im 21/104]
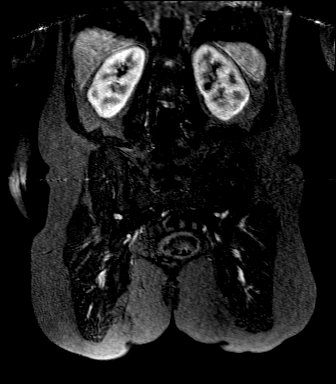
[im 42/104]
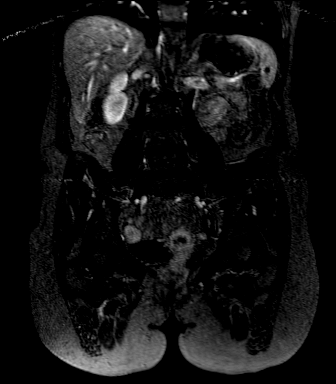
[im 62/104]
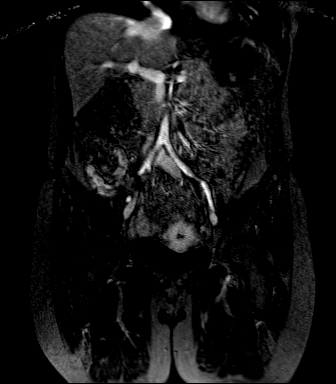
[im 83/104]
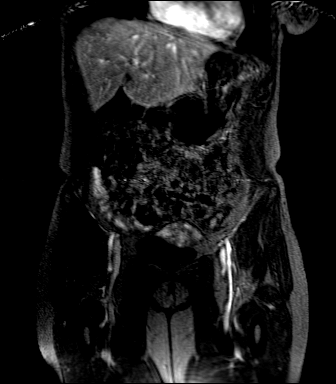
[im 104/104]
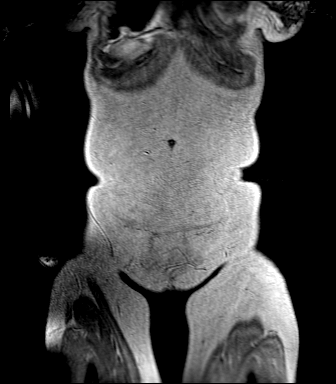

[Series 15: fl3d_ce_cor_+c_moco-std_sub · coronal · 1.2mm · 1.17mm/px · 6 of 104 slices shown (2 of 2)]
[im 1/104]
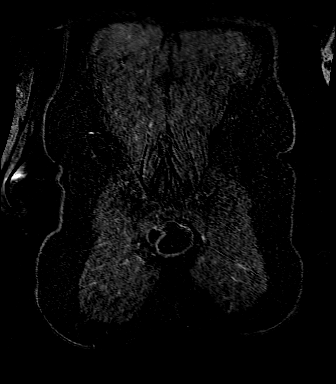
[im 21/104]
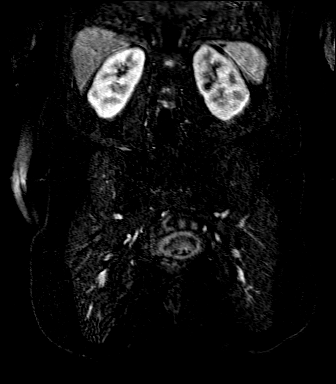
[im 42/104]
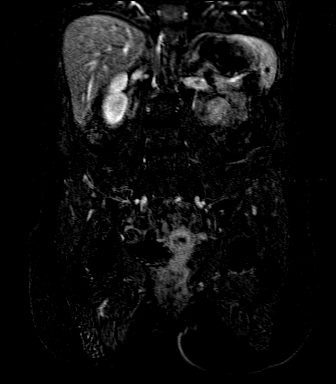
[im 62/104]
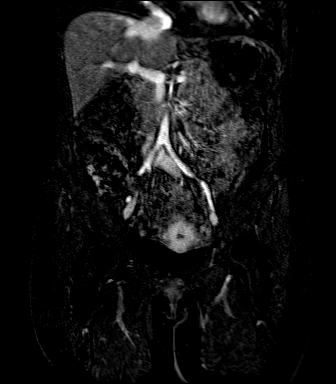
[im 83/104]
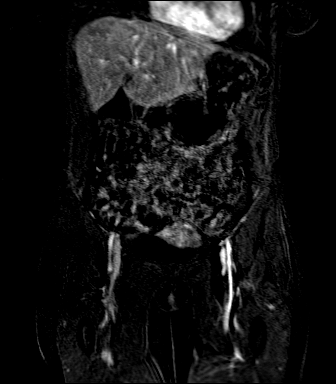
[im 104/104]
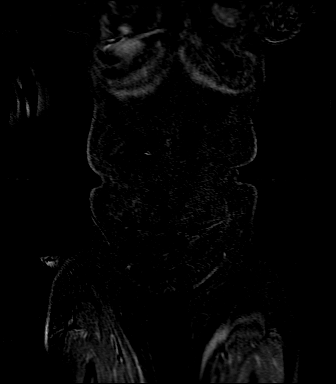

[Series 20: t1_3d_vibe_axial_(person_name)_+c_w · axial · 3.0mm · 1.33mm/px · z∈[-110,+223]mm · 6 of 112 slices shown]
[im 1/112]
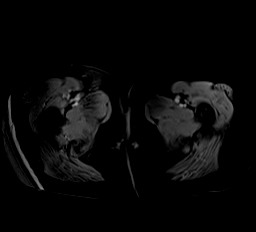
[im 23/112]
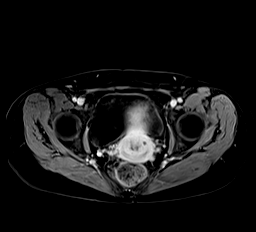
[im 45/112]
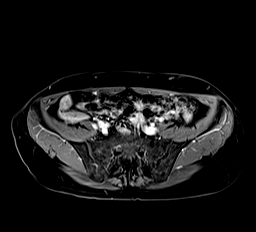
[im 67/112]
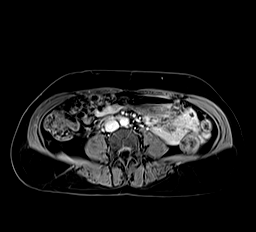
[im 89/112]
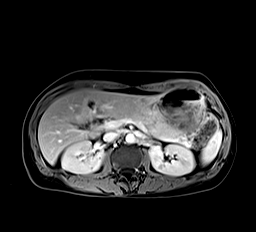
[im 112/112]
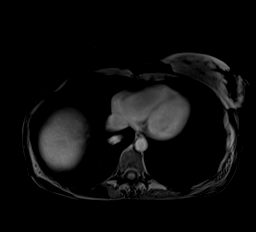

[40 of 40 positions shown; findings below may reference images not displayed]

FINDINGS: Again noted is the 17 mm fusiform right common iliac artery aneurysm starting at the origin, and extending down slightly into the right external iliac artery.

Bilateral 50% common iliac artery focal stenoses are present, about 2 cm below the common iliac bifurcations.

The remaining pelvic vasculature looks widely patent down to the proximal thigh level where the SFAs also look patent.
IMPRESSION: 17 mm fusiform right common iliac artery aneurysm.

## 2022-04-07 IMAGING — US US NON OB TRANSVAGINAL W LTD TA
1 series · 14 of 28 positions shown · non-contrast
Comparison: None

HISTORY: 50 year-old female with abnormal vaginal bleeding, pain female pelvic .
TECHNIQUE: Transabdominal and transvaginal ultrasound examinations of pelvis were performed.

[Series 1: us non ob transvaginal w ltd ta · 14 of 31 slices shown]
[im 2/31]
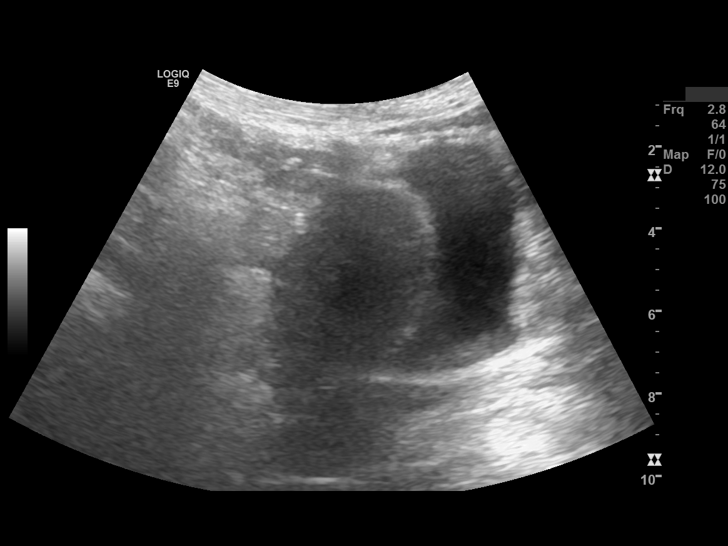
[im 4/31]
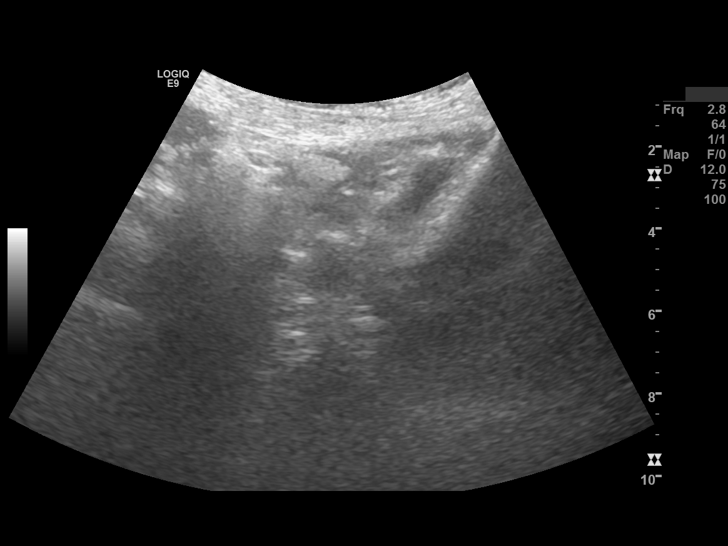
[im 6/31]
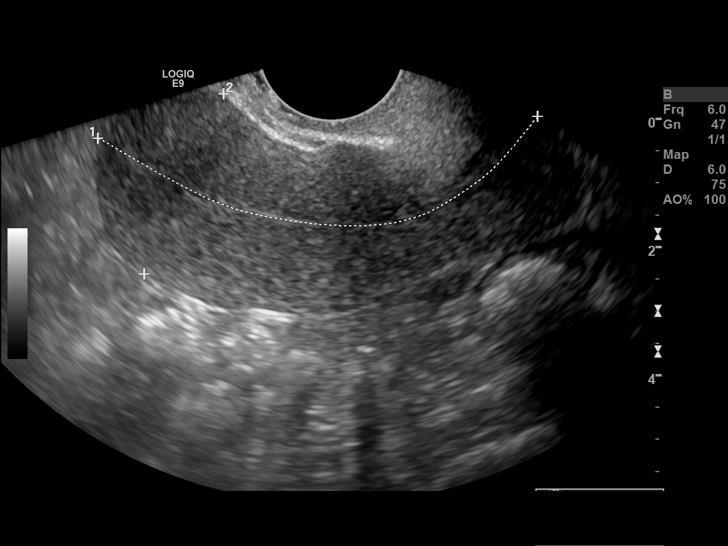
[im 8/31]
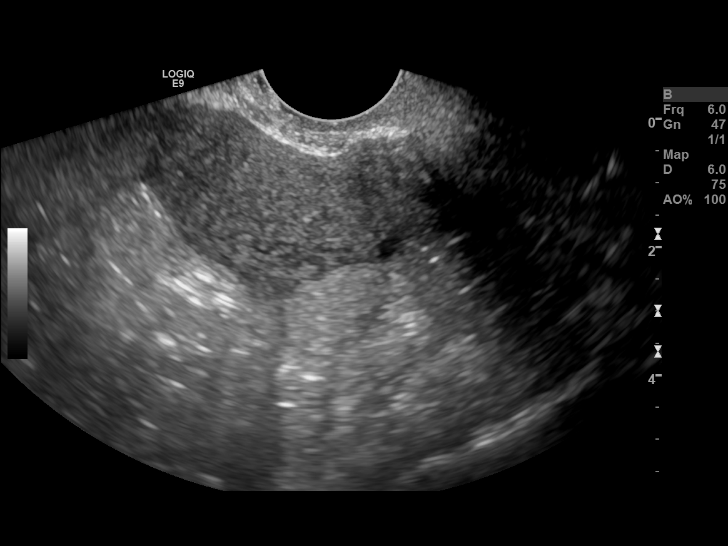
[im 11/31]
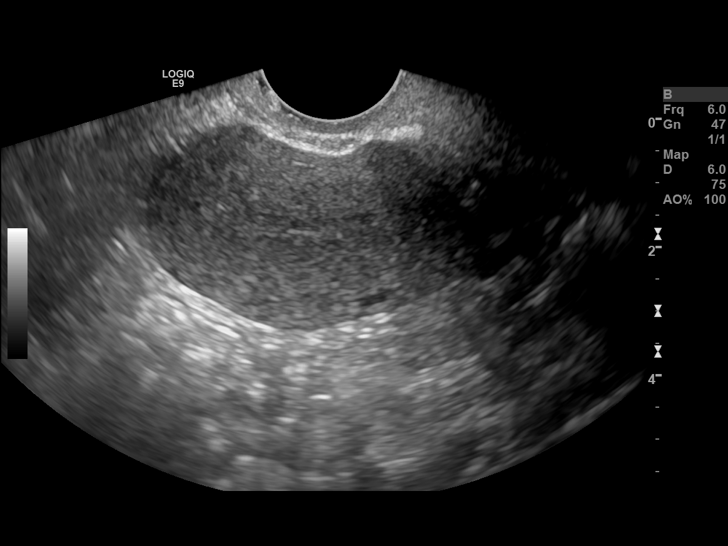
[im 13/31]
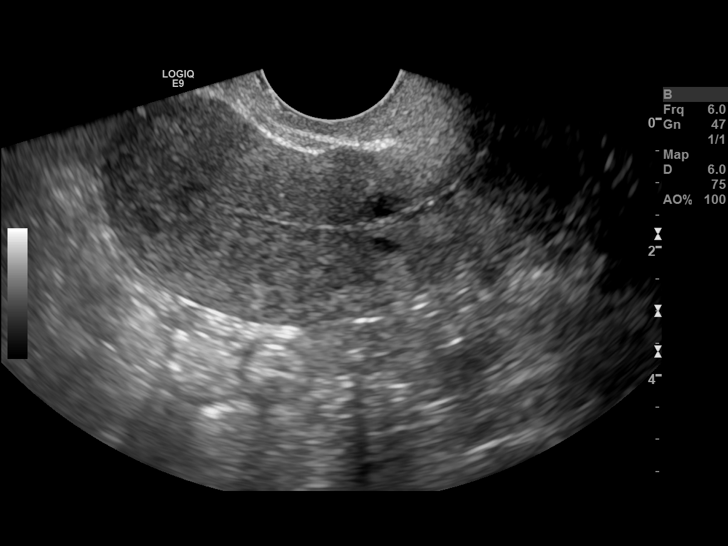
[im 15/31]
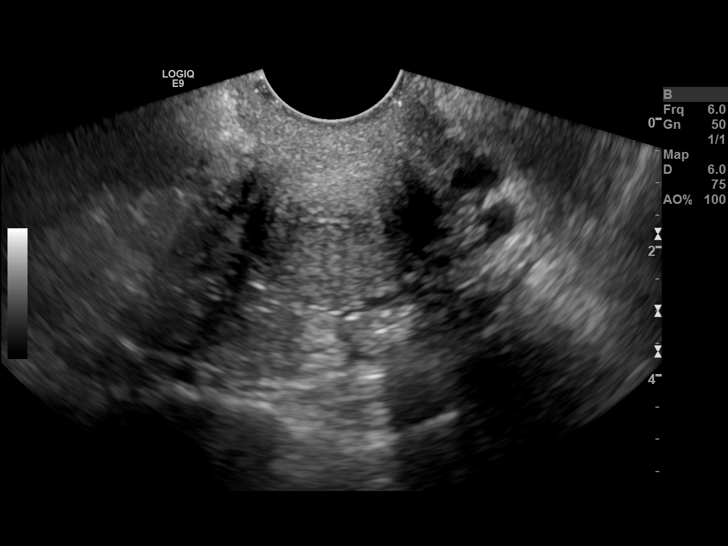
[im 17/31]
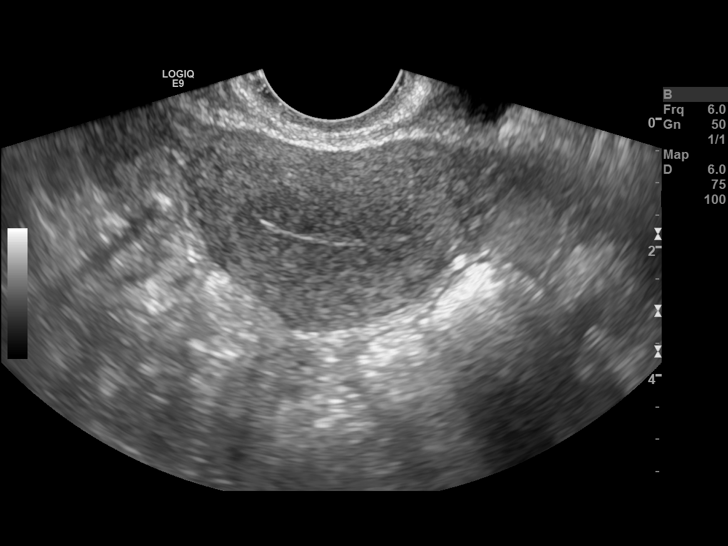
[im 19/31]
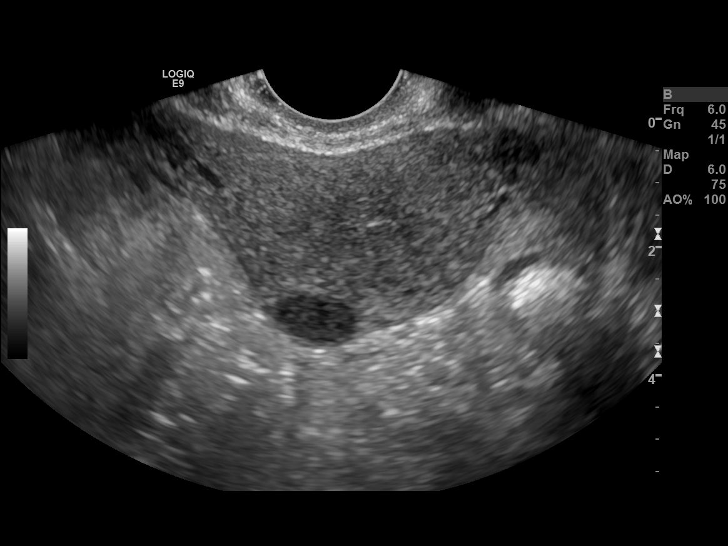
[im 22/31]
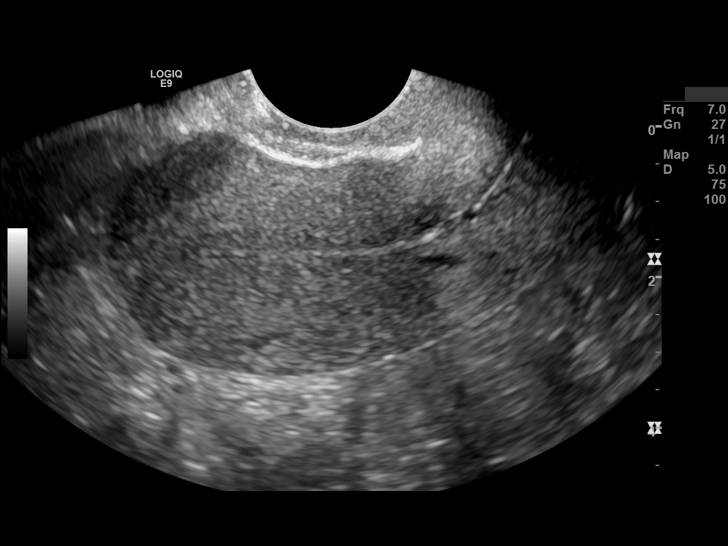
[im 24/31]
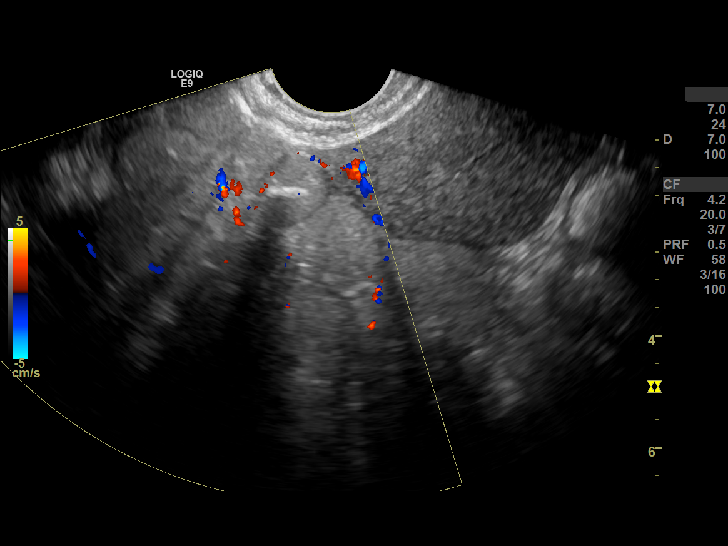
[im 26/31]
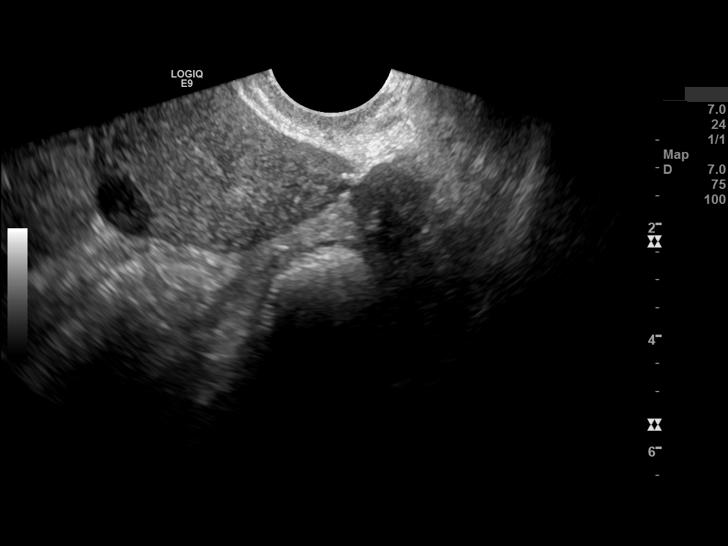
[im 28/31]
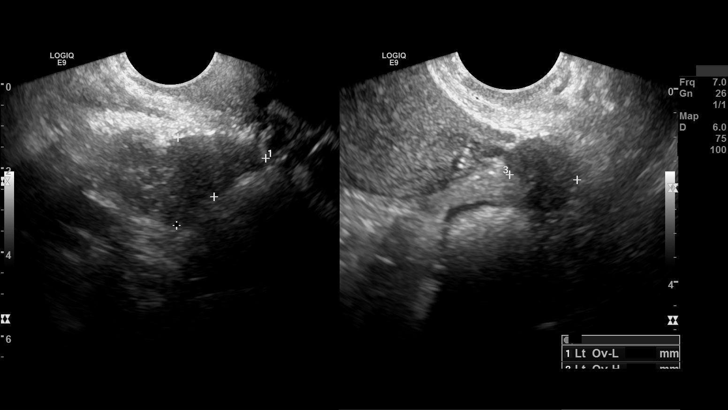
[im 31/31]
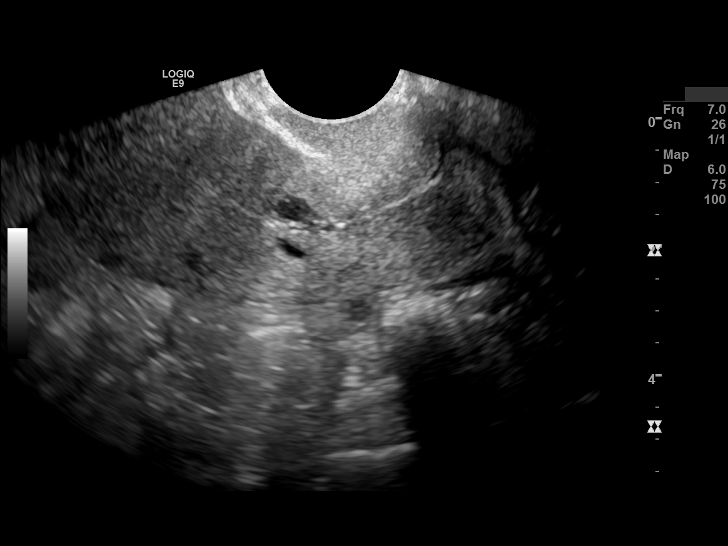

[14 of 28 positions shown; findings below may reference images not displayed]

FINDINGS: The uterus measures 77.65 x 30.7 x 42.01 mm. Single 13 mm small subserosal uterine fibroid. Anteverted uterus.

The endometrium measures 2.4 mm. 

Right ovary not visualized.

The left ovary measures 26.6 x 16.5 x 14.1 mm.  Left ovary volume is 3.23 ml.

Doppler arterial and venous color flows are present in both ovaries. No adnexal mass.

Cul-de-sac: There is no significant fluid.
IMPRESSION: 1.
Single 13 mm subserosal uterine fibroid.

2.
Nonvisualization of the right ovary.

3.
Normal left ovary.

## 2022-07-15 IMAGING — CR ELBOW LT 3 VWS MIN
1 series · 4 of 4 positions shown · non-contrast
Comparison: None

Images Obtained from [facility]
Left elbow radiographs, 3 views
INDICATION: Pain in left elbow

[Series 1: lat · 0.17mm/px · 4 of 4 slices shown]
[im 1/4]
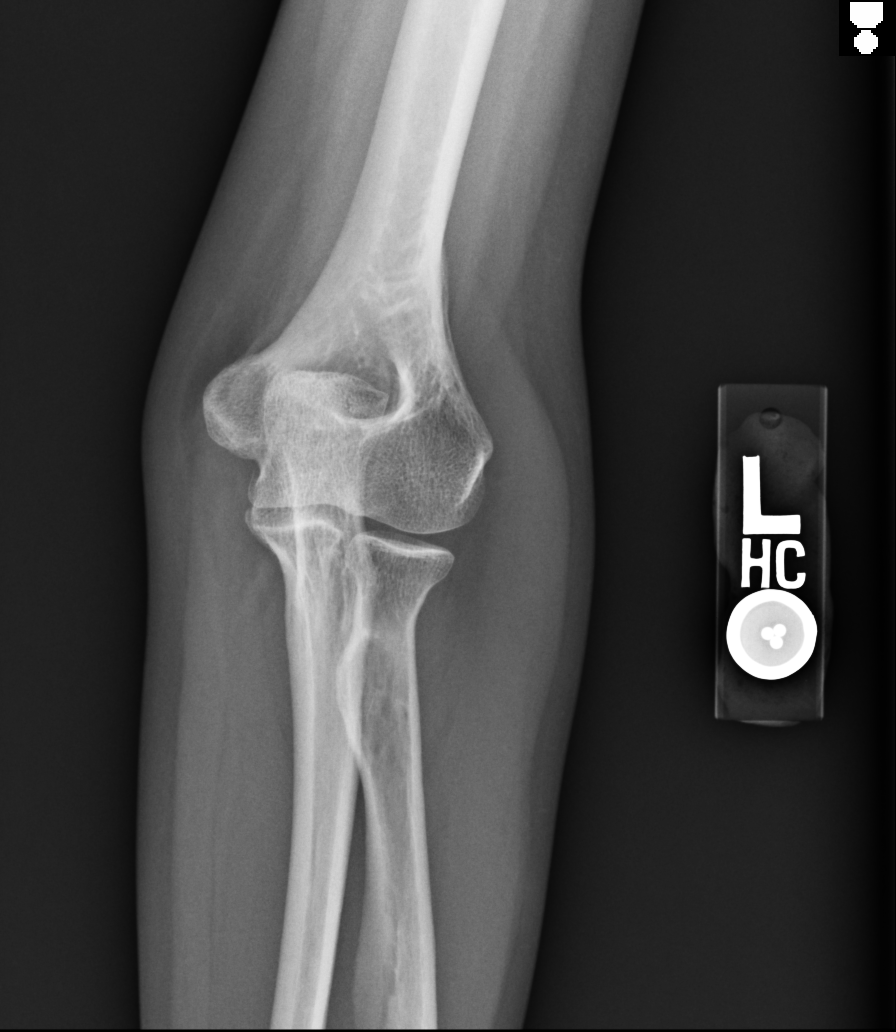
[im 2/4]
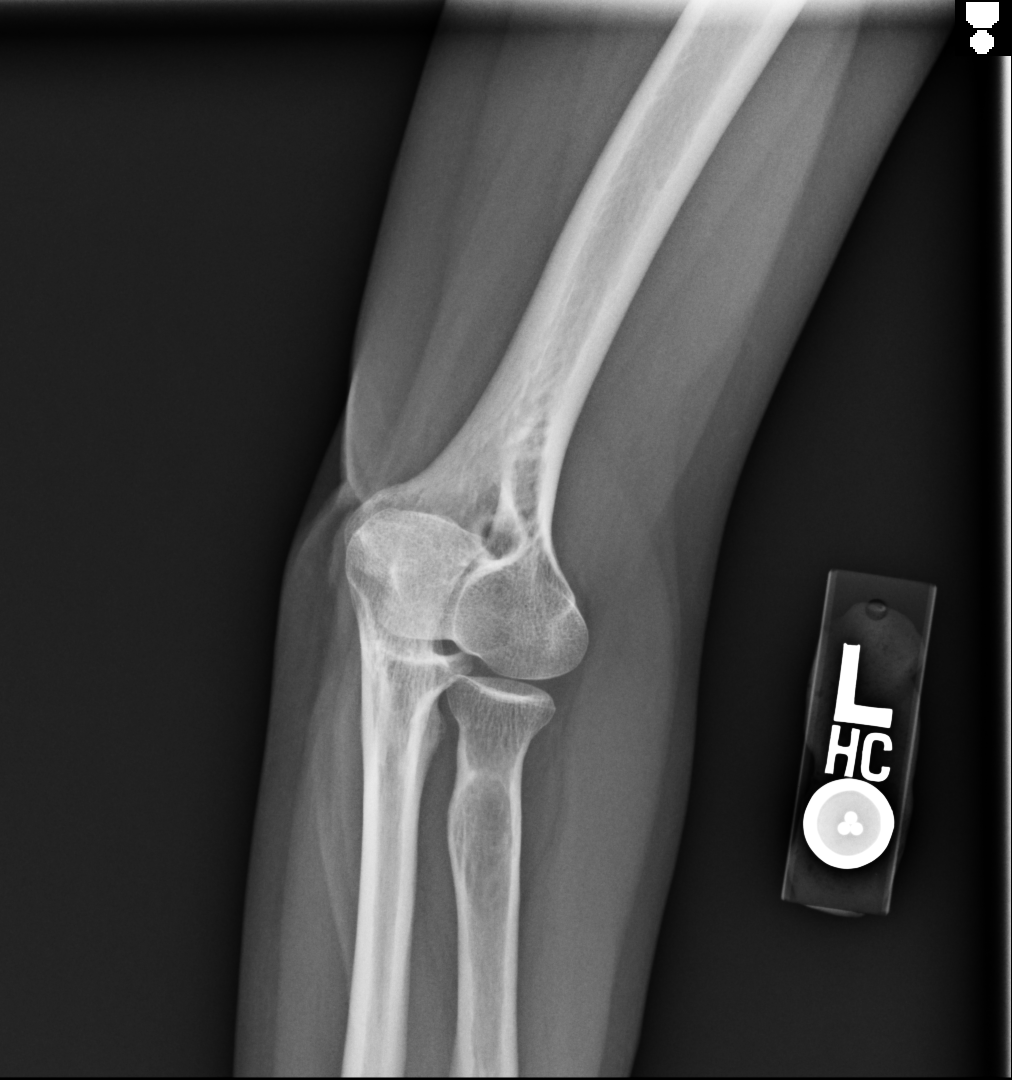
[im 3/4]
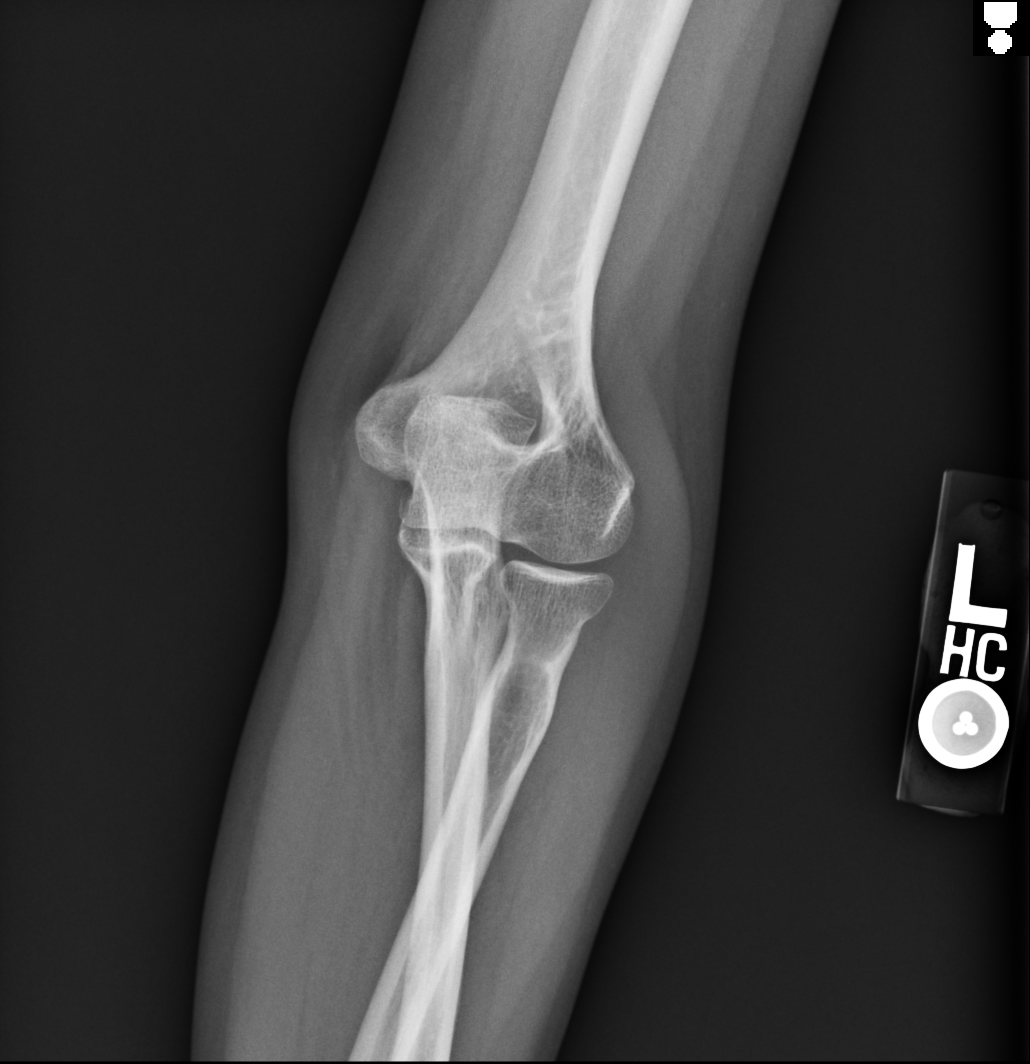
[im 4/4]
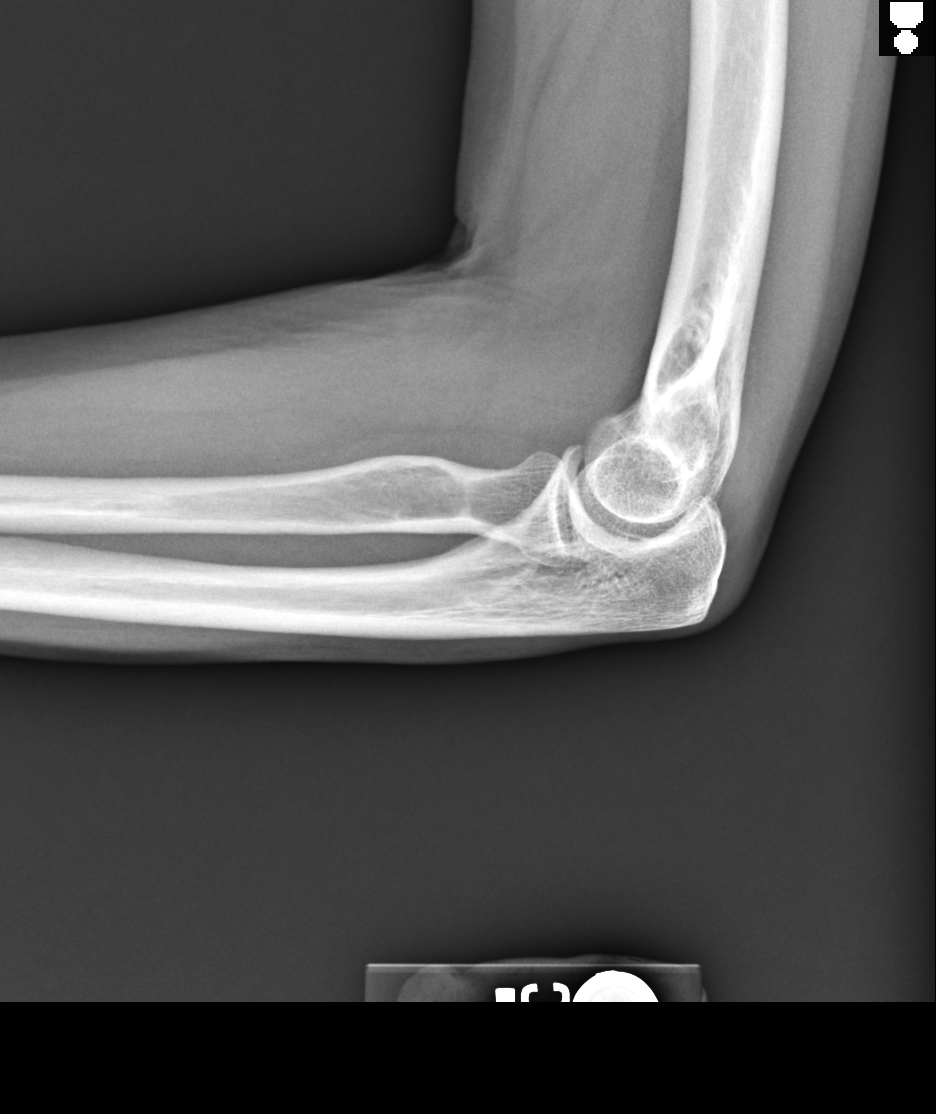

[4 of 4 positions shown; findings below may reference images not displayed]

FINDINGS: No fracture. No dislocation. No joint effusion. Joint spaces are intact.
IMPRESSION: Unremarkable left elbow radiographs.

## 7884-07-17 DEATH — deceased
